# Patient Record
Sex: Male | Born: 1935 | Hispanic: No | Marital: Married | Smoking: Former smoker
Health system: Southern US, Community
[De-identification: ages and names within clinical notes are randomized; demographics above are authoritative.]

## PROBLEM LIST (undated history)

## (undated) DIAGNOSIS — R0602 Shortness of breath: Secondary | ICD-10-CM

## (undated) DIAGNOSIS — I10 Essential (primary) hypertension: Secondary | ICD-10-CM

## (undated) DIAGNOSIS — K219 Gastro-esophageal reflux disease without esophagitis: Secondary | ICD-10-CM

## (undated) DIAGNOSIS — Z9289 Personal history of other medical treatment: Secondary | ICD-10-CM

## (undated) DIAGNOSIS — I2510 Atherosclerotic heart disease of native coronary artery without angina pectoris: Secondary | ICD-10-CM

## (undated) DIAGNOSIS — H5789 Other specified disorders of eye and adnexa: Secondary | ICD-10-CM

## (undated) HISTORY — DX: Atherosclerotic heart disease of native coronary artery without angina pectoris: I25.10

## (undated) HISTORY — DX: Essential (primary) hypertension: I10

## (undated) HISTORY — DX: Personal history of other medical treatment: Z92.89

## (undated) HISTORY — DX: Other specified disorders of eye and adnexa: H57.89

## (undated) HISTORY — DX: Shortness of breath: R06.02

---

## 2013-12-30 ENCOUNTER — Encounter: Payer: Self-pay | Admitting: Cardiology

## 2014-01-12 ENCOUNTER — Ambulatory Visit (INDEPENDENT_AMBULATORY_CARE_PROVIDER_SITE_OTHER): Payer: No Typology Code available for payment source | Admitting: Cardiology

## 2014-01-12 ENCOUNTER — Encounter: Payer: Self-pay | Admitting: Cardiology

## 2014-01-12 VITALS — BP 172/102 | HR 106 | Ht 65.0 in | Wt 142.0 lb

## 2014-01-12 DIAGNOSIS — I4510 Unspecified right bundle-branch block: Secondary | ICD-10-CM

## 2014-01-12 DIAGNOSIS — R0600 Dyspnea, unspecified: Secondary | ICD-10-CM | POA: Insufficient documentation

## 2014-01-12 DIAGNOSIS — R9431 Abnormal electrocardiogram [ECG] [EKG]: Secondary | ICD-10-CM | POA: Insufficient documentation

## 2014-01-12 DIAGNOSIS — R0989 Other specified symptoms and signs involving the circulatory and respiratory systems: Secondary | ICD-10-CM

## 2014-01-12 DIAGNOSIS — I10 Essential (primary) hypertension: Secondary | ICD-10-CM

## 2014-01-12 DIAGNOSIS — R0609 Other forms of dyspnea: Secondary | ICD-10-CM

## 2014-01-12 MED ORDER — LOSARTAN POTASSIUM-HCTZ 50-12.5 MG PO TABS: 1.0000 | ORAL_TABLET | Freq: Every day | ORAL | Status: DC

## 2014-01-12 NOTE — Progress Notes (Signed)
      [address] [place], [place]  [address] Phone: [phone] Fax:  [phone]  Date:  01/12/2014   ID:  Peter Kennedy, DOB [birth year], MRN [id number]  PCP:  No primary provider on file.   History of Present Illness: Peter Kennedy is a 79 y.o. male here for the evaluation of abnormal EKG, right bundle branch block, shortness of breath, angina. EKG personally reviewed from 12/30/13 which shows sinus rhythm rate 91 with right bundle branch block morphology and nonspecific ST-T wave changes. Creatinine is 0.85, LFTs are normal, hemoglobin 14.3 TSH is normal.   When traveling stairs can have SOB and mild CP. No radiation of symptoms. Relieved with rest. Concerned about the possibility of coronary artery disease. He was previously treated for hypertension in [place].   Wt Readings from Last 3 Encounters:  01/12/14 142 lb (64.411 kg)     Past Medical History  Diagnosis Date  . Hypertension   . Coronary artery disease   . SOB (shortness of breath)   . Eye inflamed     History reviewed. No pertinent past surgical history.  Current Outpatient Prescriptions  Medication Sig Dispense Refill  . aspirin 81 MG tablet Take 81 mg by mouth daily.      . Multiple Vitamins-Minerals (MULTIVITAMIN PO) Take by mouth daily.      . nitroGLYCERIN (NITROSTAT) 0.4 MG SL tablet Place 0.4 mg under the tongue every 5 (five) minutes as needed for chest pain.       No current facility-administered medications for this visit.    Allergies:   No Known Allergies  Social History:  The patient  reports that he has quit smoking. He does not have any smokeless tobacco history on file. He reports that he does not drink alcohol or use illicit drugs. Used to smoke 20 years ago.   History reviewed. No pertinent family history.  ROS:  Please see the history of present illness.   Denies any fevers, chills, orthopnea, PND, syncope, bleeding, melena, rash. Positive for shortness of breath, occasional  chest pain when going up stairs.  All other systems reviewed and negative.   PHYSICAL EXAM: VS:  BP 172/102  Pulse 106  Ht 5\' 5"  (1.651 m)  Wt 142 lb (64.411 kg)  BMI 23.63 kg/m2 Well nourished, well developed, in no acute distress HEENT: normal, [place]/AT, EOMI Neck: no JVD, normal carotid upstroke, no bruit Cardiac:  normal S1, S2; RRR; no murmur Lungs:  clear to auscultation bilaterally, no wheezing, rhonchi or rales Abd: soft, nontender, no hepatomegaly, no bruits Ext: no edema, 2+ distal pulses Skin: warm and dry GU: deferred Neuro: no focal abnormalities noted, AAO x 3  EKG:  Sinus tachycardia, 106, nonspecific interventricular conduction delay  prior EKG demonstrates right bundle branch block. Labs: Normal TSH, normal creatinine, normal hemoglobin. Prior medical record reviewed.  ASSESSMENT AND PLAN:  1. Dyspnea on exertion-could be because of uncontrolled hypertension. Other possibilities include angina, cardiomyopathy, CAD. 2. Hypertension-uncontrolled. On repeat his blood pressure was 180/90. I would like to go ahead and start him on antihypertensive medication losartan/HCT 50/12.5. I will see him back in one week, APP clinic. If blood pressure under good control, please set him up for a nuclear stress test. I will also check an echocardiogram. 3. Abnormal EKG-previous right bundle branch block noted, nonspecific interventricular conduction delay on current EKG.  Signed, Donato Schultz, MD [facility]  01/12/2014 2:55 PM

## 2014-01-12 NOTE — Patient Instructions (Addendum)
Your physician has recommended you make the following change in your medication:   1. Start Losartan-HCTZ 50-12.5mg  once daily  Your physician has requested that you have an echocardiogram. Echocardiography is a painless test that uses sound waves to create images of your heart. It provides your doctor with information about the size and shape of your heart and how well your heart's chambers and valves are working. This procedure takes approximately one hour. There are no restrictions for this procedure.  Your physician recommends that you schedule a follow-up appointment in: 1-2 week with Dr. Anne Fu or PA/NP.

## 2014-01-20 ENCOUNTER — Ambulatory Visit (HOSPITAL_COMMUNITY): Payer: No Typology Code available for payment source

## 2014-01-21 ENCOUNTER — Ambulatory Visit (INDEPENDENT_AMBULATORY_CARE_PROVIDER_SITE_OTHER): Payer: No Typology Code available for payment source | Admitting: Physician Assistant

## 2014-01-21 ENCOUNTER — Encounter: Payer: Self-pay | Admitting: Physician Assistant

## 2014-01-21 VITALS — BP 130/82 | HR 98 | Ht 65.0 in | Wt 140.0 lb

## 2014-01-21 DIAGNOSIS — R079 Chest pain, unspecified: Secondary | ICD-10-CM

## 2014-01-21 DIAGNOSIS — I10 Essential (primary) hypertension: Secondary | ICD-10-CM

## 2014-01-21 DIAGNOSIS — R9431 Abnormal electrocardiogram [ECG] [EKG]: Secondary | ICD-10-CM

## 2014-01-21 DIAGNOSIS — R0602 Shortness of breath: Secondary | ICD-10-CM

## 2014-01-21 MED ORDER — FLUTICASONE-SALMETEROL 250-50 MCG/DOSE IN AEPB: 1.0000 | INHALATION_SPRAY | Freq: Two times a day (BID) | RESPIRATORY_TRACT | Status: AC

## 2014-01-21 MED ORDER — ALBUTEROL SULFATE HFA 108 (90 BASE) MCG/ACT IN AERS: 2.0000 | INHALATION_SPRAY | Freq: Four times a day (QID) | RESPIRATORY_TRACT | Status: AC | PRN

## 2014-01-21 NOTE — Progress Notes (Signed)
[address] [place], [place]  [address] Phone: [phone]([phone] Fax:  [phone]  Date:  01/21/2014   ID:  Peter DibblesMalik Kennedy, DOB 06/05/1935, MRN [id number]  PCP:  Marnette BurgessMonica Bartorelli, NP ([facility]) Cardiologist:  Dr. Donato SchultzMark Skains     History of Present Illness: Peter DibblesMalik Bartling is a 79 y.o. [place] male who was recently evaluated by Dr. Donato SchultzMark Skains for abnormal ECG (RBBB), uncontrolled HTN, dyspnea and chest pain.  Hyzaar was added for BP control.  Echocardiogram is pending.  He will eventually need a stress test to exclude ischemia.  Of note, the patient has been placed on Proventil HFA as well as Advair 250/50 by his primary care provider. His breathing is better. He denies chest pain or syncope. He probably describes NYHA class II symptoms. He denies orthopnea, PND or edema. He does have a chronic cough.   Recent Labs: No results found for requested labs within last 365 days.   Wt Readings from Last 3 Encounters:  01/21/14 140 lb (63.504 kg)  01/12/14 142 lb (64.411 kg)     Past Medical History  Diagnosis Date  . Hypertension   . Coronary artery disease   . SOB (shortness of breath)   . Eye inflamed     Current Outpatient Prescriptions  Medication Sig Dispense Refill  . aspirin 81 MG tablet Take 81 mg by mouth daily.      Marland Kitchen. losartan-hydrochlorothiazide (HYZAAR) 50-12.5 MG per tablet Take 1 tablet by mouth daily.  30 tablet  3  . Multiple Vitamins-Minerals (MULTIVITAMIN PO) Take by mouth daily.      . nitroGLYCERIN (NITROSTAT) 0.4 MG SL tablet Place 0.4 mg under the tongue every 5 (five) minutes as needed for chest pain.      Marland Kitchen. albuterol (PROVENTIL HFA;VENTOLIN HFA) 108 (90 BASE) MCG/ACT inhaler Inhale 2 puffs into the lungs every 6 (six) hours as needed for wheezing or shortness of breath.      . Fluticasone-Salmeterol (ADVAIR) 250-50 MCG/DOSE AEPB Inhale 1 puff into the lungs 2 (two) times daily.       No current facility-administered medications for  this visit.    Allergies:   Review of patient's allergies indicates no known allergies.   Social History:  The patient  reports that he has quit smoking. He does not have any smokeless tobacco history on file. He reports that he does not drink alcohol or use illicit drugs.   Family History:  The patient's family history is not on file.   ROS:  Please see the history of present illness.      All other systems reviewed and negative.   PHYSICAL EXAM: VS:  BP 130/82  Pulse 98  Ht 5\' 5"  (1.651 m)  Wt 140 lb (63.504 kg)  BMI 23.30 kg/m2 Well nourished, well developed, in no acute distress HEENT: normal Neck: no JVD Cardiac:  distant S1, S2; RRR; no murmur Lungs:  Decreased breath sounds bilaterally, diffuse expiratory wheezes Abd: soft, nontender, no hepatomegaly Ext: no edema Skin: warm and dry Neuro:  CNs 2-12 intact, no focal abnormalities noted  EKG:  NSR, HR 98, IVCD versus LBBB, no change from prior tracing     ASSESSMENT AND PLAN:  1. Dyspnea: Improved. Question if this is improved with improved blood pressure control versus treatment for underlying pulmonary issues. I suspect he probably has COPD. Echocardiogram is pending. [facility] previously suggested by Dr. Anne FuSkains. 2. Hypertension:  Better control. Arrange  follow up basic metabolic panel. 3. Disposition:  Follow up with me or Dr. Anne Fu in one to 2 months.  Signed, Tereso Newcomer, PA-C  01/21/2014 10:41 AM

## 2014-01-21 NOTE — Patient Instructions (Signed)
YOU HAVE BEEN GIVEN AN RX FOR LAB WORK TO BE DONE WITH YOUR PRIMARY CARE PHYSICIAN WITH THE RESULTS TO BE FAXED TO [place] [phone]  Your physician has requested that you have a lexiscan myoview. For further information please visit [email or web address] Please follow instruction sheet, as given.  Your physician recommends that you schedule a follow-up appointment in: 3-4 WEEKS AFTER ECHO AND MYOVIEW ARE DONE ; WITH SCOTT WEAVER, PAC SAME DAY DR. Anne Fu IS IN THE OFFICE

## 2014-01-22 ENCOUNTER — Encounter: Payer: Self-pay | Admitting: Physician Assistant

## 2014-01-28 ENCOUNTER — Ambulatory Visit (HOSPITAL_COMMUNITY): Payer: No Typology Code available for payment source

## 2014-01-29 ENCOUNTER — Telehealth: Payer: Self-pay | Admitting: Physician Assistant

## 2014-01-29 NOTE — Telephone Encounter (Signed)
Please request most recent BMET from patient's PCP. Tereso Newcomer, PA-C   01/29/2014 1:54 PM

## 2014-02-02 NOTE — Telephone Encounter (Signed)
Last bmet done 12/30/13; labs faxed over today for PA to review

## 2014-02-02 NOTE — Telephone Encounter (Signed)
I will call pt's PCP and bmet

## 2014-02-03 ENCOUNTER — Ambulatory Visit (HOSPITAL_COMMUNITY): Payer: No Typology Code available for payment source | Attending: Internal Medicine | Admitting: Radiology

## 2014-02-03 VITALS — BP 152/83 | Ht 65.0 in | Wt 140.0 lb

## 2014-02-03 DIAGNOSIS — R0602 Shortness of breath: Secondary | ICD-10-CM | POA: Insufficient documentation

## 2014-02-03 DIAGNOSIS — R079 Chest pain, unspecified: Secondary | ICD-10-CM | POA: Insufficient documentation

## 2014-02-03 DIAGNOSIS — I447 Left bundle-branch block, unspecified: Secondary | ICD-10-CM | POA: Insufficient documentation

## 2014-02-03 DIAGNOSIS — E785 Hyperlipidemia, unspecified: Secondary | ICD-10-CM | POA: Insufficient documentation

## 2014-02-03 DIAGNOSIS — I2510 Atherosclerotic heart disease of native coronary artery without angina pectoris: Secondary | ICD-10-CM | POA: Insufficient documentation

## 2014-02-03 DIAGNOSIS — R0609 Other forms of dyspnea: Secondary | ICD-10-CM | POA: Insufficient documentation

## 2014-02-03 DIAGNOSIS — I10 Essential (primary) hypertension: Secondary | ICD-10-CM

## 2014-02-03 DIAGNOSIS — R0989 Other specified symptoms and signs involving the circulatory and respiratory systems: Secondary | ICD-10-CM | POA: Insufficient documentation

## 2014-02-03 DIAGNOSIS — I252 Old myocardial infarction: Secondary | ICD-10-CM | POA: Insufficient documentation

## 2014-02-03 DIAGNOSIS — Z87891 Personal history of nicotine dependence: Secondary | ICD-10-CM | POA: Insufficient documentation

## 2014-02-03 DIAGNOSIS — R9431 Abnormal electrocardiogram [ECG] [EKG]: Secondary | ICD-10-CM

## 2014-02-03 DIAGNOSIS — R0600 Dyspnea, unspecified: Secondary | ICD-10-CM

## 2014-02-03 DIAGNOSIS — R002 Palpitations: Secondary | ICD-10-CM | POA: Insufficient documentation

## 2014-02-03 MED ORDER — TECHNETIUM TC 99M SESTAMIBI GENERIC - CARDIOLITE
10.0000 | Freq: Once | INTRAVENOUS | Status: AC | PRN
  Administered 2014-02-03: 10 via INTRAVENOUS

## 2014-02-03 MED ORDER — REGADENOSON 0.4 MG/5ML IV SOLN
0.4000 mg | Freq: Once | INTRAVENOUS | Status: AC
  Administered 2014-02-03: 0.4 mg via INTRAVENOUS

## 2014-02-03 MED ORDER — TECHNETIUM TC 99M SESTAMIBI GENERIC - CARDIOLITE
30.0000 | Freq: Once | INTRAVENOUS | Status: AC | PRN
  Administered 2014-02-03: 30 via INTRAVENOUS

## 2014-02-03 NOTE — Progress Notes (Signed)
[facility] SITE 3 NUCLEAR MED [address] [place], [place] [address] [phone]    Cardiology Nuclear Med Study  Peter Kennedy is a 79 y.o. male     MRN : [id number]     DOB: Feb 05, 1935  Procedure Date: 02/03/2014  Nuclear Med Background Indication for Stress Test:  Evaluation for Ischemia History: CAD;MI (78yrs ago); Cardiac Risk Factors: History of Smoking, Hypertension, LBBB and Lipids  Symptoms:  Chest Pain, Chest Pain with Exertion (last date of chest discomfort 01/2014), DOE, Palpitations and SOB   Nuclear Pre-Procedure Caffeine/Decaff Intake:  9:00pm NPO After: 12:00am   Lungs:  clear O2 Sat: 97% on room air. IV 0.9% NS with Angio Cath:  22g  IV Site: R Wrist x 1, tolerated well IV Started by:  Peter Hong, RN  Chest Size (in):  40 Cup Size: n/a  Height: 5\' 5"  (1.651 m)  Weight:  140 lb (63.504 kg)  BMI:  Body mass index is 23.3 kg/(m^2). Tech Comments:  No medications today    Nuclear Med Study 1 or 2 day study: 1 day  Stress Test Type:  Lexiscan  Reading MD: N/A  Order Authorizing Provider:  Donato Schultz, MD  Resting Radionuclide: Technetium 53m Sestamibi  Resting Radionuclide Dose: 11.0 mCi   Stress Radionuclide:  Technetium 88m Sestamibi  Stress Radionuclide Dose: 33.0 mCi           Stress Protocol Rest HR: 82 Stress HR: 97  Rest BP: 152/83 Stress BP: 163/94  Exercise Time (min): n/a METS: n/a   Predicted Max HR: 143 bpm % Max HR: 67.83 bpm Rate Pressure Product: 27035   Dose of Adenosine (mg):  n/a Dose of Lexiscan: 0.4 mg  Dose of Atropine (mg): n/a Dose of Dobutamine: n/a mcg/kg/min (at max HR)  Stress Test Technologist: Peter Kennedy, EMT-P  Nuclear Technologist:  Peter Kennedy, CNMT     Rest Procedure:  Myocardial perfusion imaging was performed at rest 45 minutes following the intravenous administration of Technetium 58m Sestamibi. Rest ECG: Normal sinus rhythm with left bundle-branch block.  Stress Procedure:  The patient  received IV Lexiscan 0.4 mg over 15-seconds.  Technetium 40m Sestamibi injected at 30-seconds.  Quantitative spect images were obtained after a 45 minute delay. Stress ECG: No significant change from baseline ECG  QPS Raw Data Images:  Normal; no motion artifact; normal heart/lung ratio. Stress Images:  There is a large area of severe decreased uptake affecting the mid/apical inferior segments, mid inferoseptal segment, apical septal segment, apical cap, base/mid inferolateral segments, base/mid anterolateral segments, and the apical lateral segment. These abnormalities are fixed. Rest Images:  The rest image is the same as the stress image. Subtraction (SDS):  There is no obvious ischemia. Transient Ischemic Dilatation (Normal <1.22):  0.98 Lung/Heart Ratio (Normal <0.45):  0.38  Quantitative Gated Spect Images QGS EDV:  106 ml QGS ESV:  65 ml  Impression Exercise Capacity:  Lexiscan with no exercise. BP Response:  Normal blood pressure response. Clinical Symptoms:  No significant symptoms noted. ECG Impression:  No significant ST segment change suggestive of ischemia. Comparison with Prior Nuclear Study: No images to compare  Overall Impression:  This study is abnormal with moderate risk. There is left ventricular dysfunction. There is evidence of an old scar that is large. There is no significant ischemia. The scar affects the inferior wall and the apex in the inferolateral wall primarily. It also affects partially the anterolateral wall.  LV Ejection Fraction: 38%.  LV Wall  Motion:  Hypokinesis of the septum, inferior wall and lateral wall.  [name], MD

## 2014-02-04 ENCOUNTER — Ambulatory Visit (HOSPITAL_COMMUNITY)
Admission: RE | Admit: 2014-02-04 | Discharge: 2014-02-04 | Disposition: A | Discharge status: Home / Self Care | Payer: No Typology Code available for payment source | Source: Ambulatory Visit | Attending: Cardiology | Admitting: Cardiology

## 2014-02-04 ENCOUNTER — Encounter: Payer: Self-pay | Admitting: Physician Assistant

## 2014-02-04 DIAGNOSIS — I059 Rheumatic mitral valve disease, unspecified: Secondary | ICD-10-CM

## 2014-02-04 DIAGNOSIS — R0600 Dyspnea, unspecified: Secondary | ICD-10-CM

## 2014-02-04 DIAGNOSIS — R0989 Other specified symptoms and signs involving the circulatory and respiratory systems: Principal | ICD-10-CM | POA: Insufficient documentation

## 2014-02-04 DIAGNOSIS — R0609 Other forms of dyspnea: Secondary | ICD-10-CM | POA: Insufficient documentation

## 2014-02-04 NOTE — Progress Notes (Signed)
2D Echo Performed 02/04/2014    Taiyo Kozma, RCS  

## 2014-02-08 ENCOUNTER — Telehealth: Payer: Self-pay | Admitting: *Deleted

## 2014-02-08 ENCOUNTER — Other Ambulatory Visit: Payer: No Typology Code available for payment source

## 2014-02-08 ENCOUNTER — Encounter: Payer: Self-pay | Admitting: Cardiology

## 2014-02-08 ENCOUNTER — Ambulatory Visit (INDEPENDENT_AMBULATORY_CARE_PROVIDER_SITE_OTHER): Payer: No Typology Code available for payment source | Admitting: Cardiology

## 2014-02-08 VITALS — BP 120/68 | HR 94 | Ht 65.0 in | Wt 144.0 lb

## 2014-02-08 DIAGNOSIS — I428 Other cardiomyopathies: Secondary | ICD-10-CM

## 2014-02-08 DIAGNOSIS — I4510 Unspecified right bundle-branch block: Secondary | ICD-10-CM

## 2014-02-08 DIAGNOSIS — R0600 Dyspnea, unspecified: Secondary | ICD-10-CM

## 2014-02-08 DIAGNOSIS — I10 Essential (primary) hypertension: Secondary | ICD-10-CM

## 2014-02-08 DIAGNOSIS — R0609 Other forms of dyspnea: Secondary | ICD-10-CM

## 2014-02-08 DIAGNOSIS — R0989 Other specified symptoms and signs involving the circulatory and respiratory systems: Secondary | ICD-10-CM

## 2014-02-08 DIAGNOSIS — I420 Dilated cardiomyopathy: Secondary | ICD-10-CM

## 2014-02-08 MED ORDER — ATORVASTATIN CALCIUM 40 MG PO TABS: 40.0000 mg | ORAL_TABLET | Freq: Every day | ORAL | Status: DC

## 2014-02-08 MED ORDER — METOPROLOL SUCCINATE ER 25 MG PO TB24
25.0000 mg | ORAL_TABLET | Freq: Every day | ORAL | Status: DC
Start: 2014-02-08 — End: 2014-04-25

## 2014-02-08 NOTE — Addendum Note (Signed)
Addended by: Tonita Phoenix on: 02/08/2014 04:23 PM   Modules accepted: Orders

## 2014-02-08 NOTE — Telephone Encounter (Signed)
pt notified about lab results that were sent over from Dtr. Bartorelli's office. Pt verbalized understanding and will have repeat bmet today at appt with Dr. Anne Fu.

## 2014-02-08 NOTE — Progress Notes (Signed)
[address] [place], [place]  [address] Phone: [phone]([phone] Fax:  [phone]) [phone]  Date:  02/08/2014   ID:  Peter Kennedy, DOB 02/26/1935, MRN [id number]  PCP:  Marnette BurgessMonica Bartorelli, NP ([facility]) Cardiologist:  Dr. Donato SchultzMark Kawana Hegel     History of Present Illness: Peter Kennedy is a 79 y.o. [place] male who was recently evaluated for abnormal ECG (RBBB), uncontrolled HTN, dyspnea and chest pain.  Hyzaar was added for BP control.  Echocardiogram showed EF 25-30%.  Nuclear stress test 02/04/14: This study is abnormal with moderate risk. There is left ventricular dysfunction. There is evidence of an old scar that is large. There is no significant ischemia. The scar affects the inferior wall and the apex in the inferolateral wall primarily. It also affects partially the anterolateral wall.  LV Ejection Fraction: 38%. LV Wall Motion: Hypokinesis of the septum, inferior wall and lateral wall.    His breathing is better. He denies chest pain or syncope. He probably describes NYHA class II symptoms. He denies orthopnea, PND or edema. He does have a chronic cough.   Recent Labs: No results found for requested labs within last 365 days.   Wt Readings from Last 3 Encounters:  02/08/14 144 lb (65.318 kg)  02/03/14 140 lb (63.504 kg)  01/21/14 140 lb (63.504 kg)     Past Medical History  Diagnosis Date  . Hypertension   . Coronary artery disease   . SOB (shortness of breath)   . Eye inflamed   . Hx of cardiovascular stress test     Lexiscan Myoview (01/2014):  Inf, apical and IL scar; no ischemia; EF 38%; Mod Risk.    Current Outpatient Prescriptions  Medication Sig Dispense Refill  . albuterol (PROVENTIL HFA;VENTOLIN HFA) 108 (90 BASE) MCG/ACT inhaler Inhale 2 puffs into the lungs every 6 (six) hours as needed for wheezing or shortness of breath.      Marland Kitchen. aspirin 81 MG tablet Take 81 mg by mouth daily.      . Fluticasone-Salmeterol (ADVAIR) 250-50 MCG/DOSE AEPB Inhale 1  puff into the lungs 2 (two) times daily.      Marland Kitchen. losartan-hydrochlorothiazide (HYZAAR) 50-12.5 MG per tablet Take 1 tablet by mouth daily.      . Multiple Vitamins-Minerals (MULTIVITAMIN PO) Take by mouth daily.      . nitroGLYCERIN (NITROSTAT) 0.4 MG SL tablet Place 0.4 mg under the tongue every 5 (five) minutes as needed for chest pain.       No current facility-administered medications for this visit.    Allergies:   Review of patient's allergies indicates no known allergies.   Social History:  The patient  reports that he has quit smoking. He does not have any smokeless tobacco history on file. He reports that he does not drink alcohol or use illicit drugs.   Family History:  The patient's family history is not on file.   ROS:  Please see the history of present illness.      All other systems reviewed and negative.   PHYSICAL EXAM: VS:  BP 120/68  Pulse 94  Ht 5\' 5"  (1.651 m)  Wt 144 lb (65.318 kg)  BMI 23.96 kg/m2 Well nourished, well developed, in no acute distress HEENT: normal Neck: no JVD Cardiac:  distant S1, S2; RRR; no murmur Lungs:  Decreased breath sounds bilaterally, diffuse expiratory wheezes Abd: soft, nontender, no hepatomegaly Ext: no edema2+ radial pulses. Skin: warm and dry Neuro:  CNs 2-12  intact, no focal abnormalities noted  EKG:  NSR, HR 98, IVCD versus LBBB, no change from prior tracing     ASSESSMENT AND PLAN:  1. Dyspnea/cardiomyopathy-newly discovered. EF 25% on echo, 38% on nuclear with inferior/inferolateral scar. We have discussed cardiac catheterization, risks, benefits including stroke, heart attack, death, renal impairment, bleeding. Right radial artery approach. I would like to perform both right and left heart catheterization. Right heart catheterization can be performed via the brachial vein. He understands the risks of possible delay of cardiac catheterization. He must return home since he is an elder until March 26. I explained to him the  possible risks of travel at this time. He understands. I will start metoprolol extended release 25 mg once a day. He is not having any active angina, shortness of breath has improved with hypertension control. 2. Hypertension:  Better control. Check CMET. CBC 3. We're going to start atorvastatin 40 mg given his old infarct pattern. Check lipids. ALT in 2 months. 4. Disposition:  Follow up with me when he returns from his travels, then we will schedule the heart catheterization.  Mathews Robinsons, MD   02/08/2014 3:41 PM

## 2014-02-08 NOTE — Patient Instructions (Signed)
Your physician has recommended you make the following change in your medication:   1. Start Metoprolol  ER 25mg  once daily. 2. Start Atorvastatin 40mg  once daily.  Your physician recommends that you have labs today: CMET, CBC, Lipid  Your physician recommends that you return for lab work in: 1 months, Lipid, ALT  Your physician recommends that you schedule a follow-up appointment in: 1 month with Dr. Anne Fu. To discuss Catheterization.

## 2014-02-09 LAB — LIPID PANEL
CHOL/HDL RATIO: 7
CHOLESTEROL: 239 mg/dL — AB (ref 0–200)
HDL: 35.7 mg/dL — AB (ref >39.00)
LDL CALC: 143 mg/dL — AB (ref 0–99)
Triglycerides: 301 mg/dL — ABNORMAL HIGH (ref 0.0–149.0)
VLDL: 60.2 mg/dL — ABNORMAL HIGH (ref 0.0–40.0)

## 2014-02-09 LAB — CBC WITH DIFFERENTIAL/PLATELET
BASOS PCT: 0.7 % (ref 0.0–3.0)
Basophils Absolute: 0.1 10*3/uL (ref 0.0–0.1)
EOS PCT: 2.9 % (ref 0.0–5.0)
Eosinophils Absolute: 0.2 10*3/uL (ref 0.0–0.7)
HCT: 38.3 % — ABNORMAL LOW (ref 39.0–52.0)
Hemoglobin: 13 g/dL (ref 13.0–17.0)
LYMPHS PCT: 24.9 % (ref 12.0–46.0)
Lymphs Abs: 2 10*3/uL (ref 0.7–4.0)
MCHC: 34 g/dL (ref 30.0–36.0)
MCV: 88.1 fl (ref 78.0–100.0)
Monocytes Absolute: 0.7 10*3/uL (ref 0.1–1.0)
Monocytes Relative: 8.6 % (ref 3.0–12.0)
NEUTROS PCT: 62.9 % (ref 43.0–77.0)
Neutro Abs: 5 10*3/uL (ref 1.4–7.7)
PLATELETS: 257 10*3/uL (ref 150.0–400.0)
RBC: 4.34 Mil/uL (ref 4.22–5.81)
RDW: 13.5 % (ref 11.5–14.6)
WBC: 7.9 10*3/uL (ref 4.5–10.5)

## 2014-02-09 LAB — COMPREHENSIVE METABOLIC PANEL
ALBUMIN: 4.1 g/dL (ref 3.5–5.2)
ALT: 16 U/L (ref 0–53)
AST: 18 U/L (ref 0–37)
Alkaline Phosphatase: 58 U/L (ref 39–117)
BUN: 23 mg/dL (ref 6–23)
CALCIUM: 9 mg/dL (ref 8.4–10.5)
CHLORIDE: 106 meq/L (ref 96–112)
CO2: 28 mEq/L (ref 19–32)
Creatinine, Ser: 1 mg/dL (ref 0.4–1.5)
GFR: 74.39 mL/min (ref >60.00)
GLUCOSE: 94 mg/dL (ref 70–99)
POTASSIUM: 4.3 meq/L (ref 3.5–5.1)
Sodium: 139 mEq/L (ref 135–145)
Total Bilirubin: 0.6 mg/dL (ref 0.3–1.2)
Total Protein: 7.6 g/dL (ref 6.0–8.3)

## 2014-02-09 LAB — ALT: ALT: 16 U/L (ref 0–53)

## 2014-04-05 ENCOUNTER — Ambulatory Visit (INDEPENDENT_AMBULATORY_CARE_PROVIDER_SITE_OTHER): Payer: No Typology Code available for payment source | Admitting: Cardiology

## 2014-04-05 ENCOUNTER — Encounter: Payer: Self-pay | Admitting: *Deleted

## 2014-04-05 ENCOUNTER — Encounter (INDEPENDENT_AMBULATORY_CARE_PROVIDER_SITE_OTHER): Payer: Self-pay

## 2014-04-05 ENCOUNTER — Ambulatory Visit: Payer: Self-pay | Admitting: Physician Assistant

## 2014-04-05 ENCOUNTER — Other Ambulatory Visit: Payer: No Typology Code available for payment source

## 2014-04-05 ENCOUNTER — Encounter: Payer: Self-pay | Admitting: Cardiology

## 2014-04-05 VITALS — BP 120/80 | HR 76 | Ht 65.0 in | Wt 137.0 lb

## 2014-04-05 DIAGNOSIS — I4510 Unspecified right bundle-branch block: Secondary | ICD-10-CM

## 2014-04-05 DIAGNOSIS — I252 Old myocardial infarction: Secondary | ICD-10-CM

## 2014-04-05 DIAGNOSIS — I420 Dilated cardiomyopathy: Secondary | ICD-10-CM | POA: Insufficient documentation

## 2014-04-05 DIAGNOSIS — I10 Essential (primary) hypertension: Secondary | ICD-10-CM

## 2014-04-05 DIAGNOSIS — I428 Other cardiomyopathies: Secondary | ICD-10-CM

## 2014-04-05 LAB — PROTIME-INR
INR: 1 ratio (ref 0.8–1.0)
PROTHROMBIN TIME: 11.6 s (ref 9.6–13.1)

## 2014-04-05 LAB — BASIC METABOLIC PANEL
BUN: 20 mg/dL (ref 6–23)
CALCIUM: 9.4 mg/dL (ref 8.4–10.5)
CO2: 27 mEq/L (ref 19–32)
CREATININE: 1 mg/dL (ref 0.4–1.5)
Chloride: 102 mEq/L (ref 96–112)
GFR: 77.84 mL/min (ref >60.00)
Glucose, Bld: 84 mg/dL (ref 70–99)
Potassium: 4.9 mEq/L (ref 3.5–5.1)
SODIUM: 136 meq/L (ref 135–145)

## 2014-04-05 LAB — CBC WITH DIFFERENTIAL/PLATELET
BASOS PCT: 0.4 % (ref 0.0–3.0)
Basophils Absolute: 0 10*3/uL (ref 0.0–0.1)
EOS PCT: 2.3 % (ref 0.0–5.0)
Eosinophils Absolute: 0.3 10*3/uL (ref 0.0–0.7)
HCT: 43.5 % (ref 39.0–52.0)
HEMOGLOBIN: 14.6 g/dL (ref 13.0–17.0)
LYMPHS PCT: 17.7 % (ref 12.0–46.0)
Lymphs Abs: 2 10*3/uL (ref 0.7–4.0)
MCHC: 33.6 g/dL (ref 30.0–36.0)
MCV: 88.5 fl (ref 78.0–100.0)
MONOS PCT: 11.8 % (ref 3.0–12.0)
Monocytes Absolute: 1.3 10*3/uL — ABNORMAL HIGH (ref 0.1–1.0)
NEUTROS PCT: 67.8 % (ref 43.0–77.0)
Neutro Abs: 7.6 10*3/uL (ref 1.4–7.7)
Platelets: 291 10*3/uL (ref 150.0–400.0)
RBC: 4.91 Mil/uL (ref 4.22–5.81)
RDW: 13.2 % (ref 11.5–14.6)
WBC: 11.2 10*3/uL — AB (ref 4.5–10.5)

## 2014-04-05 NOTE — Patient Instructions (Signed)
Your physician recommends that you have  lab work today--BMET/CBCd/PT/INR.  Your physician has requested that you have a cardiac catheterization. Cardiac catheterization is used to diagnose and/or treat various heart conditions. Doctors may recommend this procedure for a number of different reasons. The most common reason is to evaluate chest pain. Chest pain can be a symptom of coronary artery disease (CAD), and cardiac catheterization can show whether plaque is narrowing or blocking your heart's arteries. This procedure is also used to evaluate the valves, as well as measure the blood flow and oxygen levels in different parts of your heart. For further information please visit [email or web address] Please follow instruction sheet, as given. Tuesday May 12,2015  Your physician recommends that you schedule a follow-up appointment in: about 3 weeks with Dr Anne Fu.

## 2014-04-05 NOTE — Progress Notes (Signed)
[address] [place], [place]  [address] Phone: [phone] Fax:  [phone]  Date:  04/05/2014   ID:  Peter Kennedy, DOB 07/06/1935, MRN [id number]  PCP:  Marnette Burgess, NP ([facility]) Cardiologist:  Dr. Donato Schultz     History of Present Illness: Peter Kennedy is a 79 y.o. [place] male who was recently evaluated for abnormal ECG (RBBB), uncontrolled HTN, dyspnea and chest pain.  Hyzaar was added for BP control.  Echocardiogram showed EF 25-30%.  Nuclear stress test 02/04/14: This study is abnormal with moderate risk. There is left ventricular dysfunction. There is evidence of an old scar that is large. There is no significant ischemia. The scar affects the inferior wall and the apex in the inferolateral wall primarily. It also affects partially the anterolateral wall.  LV Ejection Fraction: 38%. LV Wall Motion: Hypokinesis of the septum, inferior wall and lateral wall.    His breathing is better. He denies chest pain or syncope. He probably describes NYHA class II symptoms. He denies orthopnea, PND or edema. He does have a chronic cough.  Ready for cath. Did well on his trip.  Recent Labs: 02/08/2014: ALT 16; ALT 16; Creatinine 1.0; HDL Cholesterol 35.70*; Hemoglobin 13.0; LDL (calc) 143*; Potassium 4.3    Wt Readings from Last 3 Encounters:  04/05/14 137 lb (62.143 kg)  02/08/14 144 lb (65.318 kg)  02/03/14 140 lb (63.504 kg)     Past Medical History  Diagnosis Date  . Hypertension   . Coronary artery disease   . SOB (shortness of breath)   . Eye inflamed   . Hx of cardiovascular stress test     Lexiscan Myoview (01/2014):  Inf, apical and IL scar; no ischemia; EF 38%; Mod Risk.    Current Outpatient Prescriptions  Medication Sig Dispense Refill  . albuterol (PROVENTIL HFA;VENTOLIN HFA) 108 (90 BASE) MCG/ACT inhaler Inhale 2 puffs into the lungs every 6 (six) hours as needed for wheezing or shortness of breath.      Marland Kitchen atorvastatin  (LIPITOR) 40 MG tablet Take 1 tablet (40 mg total) by mouth daily.  30 tablet  3  . Fluticasone-Salmeterol (ADVAIR) 250-50 MCG/DOSE AEPB Inhale 1 puff into the lungs 2 (two) times daily.      Marland Kitchen losartan-hydrochlorothiazide (HYZAAR) 50-12.5 MG per tablet Take 1 tablet by mouth daily.      . metoprolol succinate (TOPROL XL) 25 MG 24 hr tablet Take 1 tablet (25 mg total) by mouth daily.  30 tablet  3  . Multiple Vitamins-Minerals (MULTIVITAMIN PO) Take by mouth daily.      . nitroGLYCERIN (NITROSTAT) 0.4 MG SL tablet Place 0.4 mg under the tongue every 5 (five) minutes as needed for chest pain.       No current facility-administered medications for this visit.    Allergies:   Review of patient's allergies indicates no known allergies.   Social History:  The patient  reports that he has quit smoking. He does not have any smokeless tobacco history on file. He reports that he does not drink alcohol or use illicit drugs.   Family History:  The patient's family history is not on file.   ROS:  Please see the history of present illness.      All other systems reviewed and negative.   PHYSICAL EXAM: VS:  BP 120/80  Pulse 76  Ht 5\' 5"  (1.651 m)  Wt 137 lb (62.143 kg)  BMI 22.80 kg/m2 Well  nourished, well developed, in no acute distressThin HEENT: normal Neck: no JVD Cardiac:  distant S1, S2; RRR; no murmur Lungs:  Decreased breath sounds bilaterally, diffuse expiratory wheezes Abd: soft, nontender, no hepatomegaly Ext: no edema2+ radial pulses. Skin: warm and dry Neuro:  CNs 2-12 intact, no focal abnormalities noted  EKG:  NSR, HR 98, IVCD versus LBBB, no change from prior tracing     ASSESSMENT AND PLAN:  1. Dyspnea/cardiomyopathy-newly discovered. Improved. EF 25% on echo, 38% on nuclear with inferior/inferolateral scar. We have discussed cardiac catheterization, risks, benefits including stroke, heart attack, death, renal impairment, bleeding. Right radial artery approach. I would like  to perform left heart catheterization. Continue metoprolol extended release 25 mg once a day. He is not having any active angina, shortness of breath has improved with hypertension control. 2. Hypertension:  Better control. Check CMET. CBC 3. We're going to start atorvastatin 40 mg given his old infarct pattern. Check lipids. ALT in 2 months. 4. Post cath fu  Signed, Donato SchultzMark Jacobs Golab, MD   04/05/2014 9:35 AM

## 2014-04-13 ENCOUNTER — Encounter (HOSPITAL_COMMUNITY): Payer: Self-pay | Admitting: Pharmacy Technician

## 2014-04-13 ENCOUNTER — Encounter (HOSPITAL_COMMUNITY)
Admission: RE | Disposition: A | Discharge status: Home / Self Care | Payer: Self-pay | Source: Ambulatory Visit | Attending: Cardiology

## 2014-04-13 ENCOUNTER — Ambulatory Visit (HOSPITAL_COMMUNITY)
Admission: RE | Admit: 2014-04-13 | Discharge: 2014-04-13 | Disposition: A | Discharge status: Home / Self Care | Payer: No Typology Code available for payment source | Source: Ambulatory Visit | Attending: Cardiology | Admitting: Cardiology

## 2014-04-13 DIAGNOSIS — Z87891 Personal history of nicotine dependence: Secondary | ICD-10-CM | POA: Insufficient documentation

## 2014-04-13 DIAGNOSIS — Z79899 Other long term (current) drug therapy: Secondary | ICD-10-CM | POA: Insufficient documentation

## 2014-04-13 DIAGNOSIS — I428 Other cardiomyopathies: Secondary | ICD-10-CM | POA: Insufficient documentation

## 2014-04-13 DIAGNOSIS — I10 Essential (primary) hypertension: Secondary | ICD-10-CM | POA: Insufficient documentation

## 2014-04-13 DIAGNOSIS — I420 Dilated cardiomyopathy: Secondary | ICD-10-CM

## 2014-04-13 DIAGNOSIS — R0600 Dyspnea, unspecified: Secondary | ICD-10-CM

## 2014-04-13 DIAGNOSIS — R9431 Abnormal electrocardiogram [ECG] [EKG]: Secondary | ICD-10-CM

## 2014-04-13 DIAGNOSIS — I4510 Unspecified right bundle-branch block: Secondary | ICD-10-CM

## 2014-04-13 DIAGNOSIS — I2510 Atherosclerotic heart disease of native coronary artery without angina pectoris: Secondary | ICD-10-CM | POA: Insufficient documentation

## 2014-04-13 DIAGNOSIS — R059 Cough, unspecified: Secondary | ICD-10-CM | POA: Insufficient documentation

## 2014-04-13 DIAGNOSIS — R05 Cough: Secondary | ICD-10-CM | POA: Insufficient documentation

## 2014-04-13 HISTORY — PX: LEFT HEART CATHETERIZATION WITH CORONARY ANGIOGRAM: SHX5451

## 2014-04-13 SURGERY — LEFT HEART CATHETERIZATION WITH CORONARY ANGIOGRAM
Anesthesia: LOCAL

## 2014-04-13 MED ORDER — MIDAZOLAM HCL 2 MG/2ML IJ SOLN
INTRAMUSCULAR | Status: AC
  Filled 2014-04-13: qty 2

## 2014-04-13 MED ORDER — FENTANYL CITRATE 0.05 MG/ML IJ SOLN
INTRAMUSCULAR | Status: AC
Start: 2014-04-13 — End: 2014-04-13
  Filled 2014-04-13: qty 2

## 2014-04-13 MED ORDER — SODIUM CHLORIDE 0.9 % IJ SOLN
3.0000 mL | INTRAMUSCULAR | Status: DC | PRN
  Administered 2014-04-13: 3 mL via INTRAVENOUS

## 2014-04-13 MED ORDER — HEPARIN (PORCINE) IN NACL 2-0.9 UNIT/ML-% IJ SOLN
INTRAMUSCULAR | Status: AC
Start: 2014-04-13 — End: 2014-04-13
  Filled 2014-04-13: qty 1000

## 2014-04-13 MED ORDER — DIAZEPAM 5 MG PO TABS
5.0000 mg | ORAL_TABLET | ORAL | Status: AC
  Administered 2014-04-13: 5 mg via ORAL

## 2014-04-13 MED ORDER — HEPARIN SODIUM (PORCINE) 1000 UNIT/ML IJ SOLN
INTRAMUSCULAR | Status: AC
  Filled 2014-04-13: qty 1

## 2014-04-13 MED ORDER — ASPIRIN 81 MG PO CHEW
CHEWABLE_TABLET | ORAL | Status: AC
  Filled 2014-04-13: qty 1

## 2014-04-13 MED ORDER — SODIUM CHLORIDE 0.9 % IJ SOLN: 3.0000 mL | Freq: Two times a day (BID) | INTRAMUSCULAR | Status: DC

## 2014-04-13 MED ORDER — NITROGLYCERIN 0.2 MG/ML ON CALL CATH LAB
INTRAVENOUS | Status: AC
Start: 2014-04-13 — End: 2014-04-13
  Filled 2014-04-13: qty 1

## 2014-04-13 MED ORDER — SODIUM CHLORIDE 0.9 % IV SOLN
1.0000 mL/kg/h | INTRAVENOUS | Status: DC
  Administered 2014-04-13: 1 mL/kg/h via INTRAVENOUS

## 2014-04-13 MED ORDER — SODIUM CHLORIDE 0.9 % IV SOLN: 1.0000 mL/kg/h | INTRAVENOUS | Status: DC

## 2014-04-13 MED ORDER — ASPIRIN 81 MG PO CHEW
81.0000 mg | CHEWABLE_TABLET | ORAL | Status: AC
  Administered 2014-04-13: 81 mg via ORAL

## 2014-04-13 MED ORDER — LIDOCAINE HCL (PF) 1 % IJ SOLN
INTRAMUSCULAR | Status: AC
  Filled 2014-04-13: qty 30

## 2014-04-13 MED ORDER — DIAZEPAM 5 MG PO TABS
ORAL_TABLET | ORAL | Status: AC
  Administered 2014-04-13: 5 mg via ORAL
  Filled 2014-04-13: qty 1

## 2014-04-13 MED ORDER — SODIUM CHLORIDE 0.9 % IV SOLN: 250.0000 mL | INTRAVENOUS | Status: DC | PRN

## 2014-04-13 NOTE — H&P (View-Only) (Signed)
[address] [place], [place]  [address] Phone: [phone] Fax:  [phone]  Date:  04/05/2014   ID:  Peter Kennedy, DOB 07/06/1935, MRN [id number]  PCP:  Marnette Burgess, NP ([facility]) Cardiologist:  Dr. Donato Schultz     History of Present Illness: Peter Kennedy is a 79 y.o. [place] male who was recently evaluated for abnormal ECG (RBBB), uncontrolled HTN, dyspnea and chest pain.  Hyzaar was added for BP control.  Echocardiogram showed EF 25-30%.  Nuclear stress test 02/04/14: This study is abnormal with moderate risk. There is left ventricular dysfunction. There is evidence of an old scar that is large. There is no significant ischemia. The scar affects the inferior wall and the apex in the inferolateral wall primarily. It also affects partially the anterolateral wall.  LV Ejection Fraction: 38%. LV Wall Motion: Hypokinesis of the septum, inferior wall and lateral wall.    His breathing is better. He denies chest pain or syncope. He probably describes NYHA class II symptoms. He denies orthopnea, PND or edema. He does have a chronic cough.  Ready for cath. Did well on his trip.  Recent Labs: 02/08/2014: ALT 16; ALT 16; Creatinine 1.0; HDL Cholesterol 35.70*; Hemoglobin 13.0; LDL (calc) 143*; Potassium 4.3    Wt Readings from Last 3 Encounters:  04/05/14 137 lb (62.143 kg)  02/08/14 144 lb (65.318 kg)  02/03/14 140 lb (63.504 kg)     Past Medical History  Diagnosis Date  . Hypertension   . Coronary artery disease   . SOB (shortness of breath)   . Eye inflamed   . Hx of cardiovascular stress test     Lexiscan Myoview (01/2014):  Inf, apical and IL scar; no ischemia; EF 38%; Mod Risk.    Current Outpatient Prescriptions  Medication Sig Dispense Refill  . albuterol (PROVENTIL HFA;VENTOLIN HFA) 108 (90 BASE) MCG/ACT inhaler Inhale 2 puffs into the lungs every 6 (six) hours as needed for wheezing or shortness of breath.      Marland Kitchen atorvastatin  (LIPITOR) 40 MG tablet Take 1 tablet (40 mg total) by mouth daily.  30 tablet  3  . Fluticasone-Salmeterol (ADVAIR) 250-50 MCG/DOSE AEPB Inhale 1 puff into the lungs 2 (two) times daily.      Marland Kitchen losartan-hydrochlorothiazide (HYZAAR) 50-12.5 MG per tablet Take 1 tablet by mouth daily.      . metoprolol succinate (TOPROL XL) 25 MG 24 hr tablet Take 1 tablet (25 mg total) by mouth daily.  30 tablet  3  . Multiple Vitamins-Minerals (MULTIVITAMIN PO) Take by mouth daily.      . nitroGLYCERIN (NITROSTAT) 0.4 MG SL tablet Place 0.4 mg under the tongue every 5 (five) minutes as needed for chest pain.       No current facility-administered medications for this visit.    Allergies:   Review of patient's allergies indicates no known allergies.   Social History:  The patient  reports that he has quit smoking. He does not have any smokeless tobacco history on file. He reports that he does not drink alcohol or use illicit drugs.   Family History:  The patient's family history is not on file.   ROS:  Please see the history of present illness.      All other systems reviewed and negative.   PHYSICAL EXAM: VS:  BP 120/80  Pulse 76  Ht 5\' 5"  (1.651 m)  Wt 137 lb (62.143 kg)  BMI 22.80 kg/m2 Well  nourished, well developed, in no acute distressThin HEENT: normal Neck: no JVD Cardiac:  distant S1, S2; RRR; no murmur Lungs:  Decreased breath sounds bilaterally, diffuse expiratory wheezes Abd: soft, nontender, no hepatomegaly Ext: no edema2+ radial pulses. Skin: warm and dry Neuro:  CNs 2-12 intact, no focal abnormalities noted  EKG:  NSR, HR 98, IVCD versus LBBB, no change from prior tracing     ASSESSMENT AND PLAN:  1. Dyspnea/cardiomyopathy-newly discovered. Improved. EF 25% on echo, 38% on nuclear with inferior/inferolateral scar. We have discussed cardiac catheterization, risks, benefits including stroke, heart attack, death, renal impairment, bleeding. Right radial artery approach. I would like  to perform left heart catheterization. Continue metoprolol extended release 25 mg once a day. He is not having any active angina, shortness of breath has improved with hypertension control. 2. Hypertension:  Better control. Check CMET. CBC 3. We're going to start atorvastatin 40 mg given his old infarct pattern. Check lipids. ALT in 2 months. 4. Post cath fu  Signed, Kailer Heindel, MD   04/05/2014 9:35 AM   

## 2014-04-13 NOTE — CV Procedure (Signed)
    CARDIAC CATHETERIZATION  PROCEDURE:  Left heart catheterization with selective coronary angiography, left ventriculogram via the radial artery approach.  INDICATIONS:  Newly discovered cardiomyopathy 25%. Dyspnea. No chest pain.   The risks, benefits, and details of the procedure were explained to the patient, including possibilities of stroke, heart attack, death, renal impairment, arterial damage, bleeding.  The patient verbalized understanding and wanted to proceed.  Informed written consent was obtained.  PROCEDURE TECHNIQUE:  Allen's test was performed pre-and post procedure and was normal. The right radial artery site was prepped and draped in a sterile fashion. One percent lidocaine was used for local anesthesia. Using the modified Seldinger technique a 5 French hydrophilic sheath was inserted into the radial artery without difficulty. 3 mg of verapamil was administered via the sheath. A Judkins right #4 catheter with the guidance of a Versicore wire was placed in the right coronary cusp and selectively cannulated the right coronary artery. After traversing the aortic arch, 3000 units of heparin IV was administered. A Judkins left #3.5 catheter was used to selectively cannulate the left main artery. Multiple views with hand injection of Omnipaque were obtained. Catheter a pigtail catheter was used to cross into the left ventricle, hemodynamics were obtained, and a left ventriculogram was performed in the RAO position with power injection. Following the procedure, sheath was removed, patient was hemodynamically stable, hemostasis was maintained with a Terumo T band.   CONTRAST:  Total of 80 ml.    FLOUROSCOPY TIME: 2.8 min.  COMPLICATIONS:  None.    HEMODYNAMICS:  Aortic pressure was 182/78mmHg; LV systolic pressure was ; LVEDP 22 mmHg.  There was no gradient between the left ventricle and aorta.    ANGIOGRAPHIC DATA:    Left main: Branches into LAD and circumflex. No  significant disease.   Left anterior descending (LAD): Proximal/mid LAD stenosis of 80%, hazy calcified plaque. Mid sized mid diagonal branch with minor luminal irregularities.   Circumflex artery (CIRC): High OM/ ramus branch, minor luminal irregularities. 30-40% proximal disease. Mid OM branch 100% proximal/mid occlusion with reconsitution, large vessel.   Right coronary artery (RCA): Diffuse disease proximal/ mid 90%, 80% mid, 95% mid/distal. Large caliber PDA. Dominant vessel.   LEFT VENTRICULOGRAM:  Left ventricular angiogram was done in the 30 RAO projection and revealed decreased left ventricular systolic function severe with an estimated ejection fraction of 25%. There is apical, mid to distal antero/inferior hypokinesis.   IMPRESSIONS:  Severe 3 vessel CAD Severely reduced left ventricular systolic function.  LVEDP 22 mmHg.  Ejection fraction 25%.  RECOMMENDATION:  CABG referral. Since pain free, will see as outpatient. Call has been placed to [facility].

## 2014-04-13 NOTE — Discharge Instructions (Signed)

## 2014-04-13 NOTE — Interval H&P Note (Signed)
History and Physical Interval Note: Translator present.  04/13/2014 9:07 AM  Peter Kennedy  has presented today for surgery, with the diagnosis of cm  The various methods of treatment have been discussed with the patient and family. After consideration of risks, benefits and other options for treatment, the patient has consented to  Procedure(s): LEFT HEART CATHETERIZATION WITH CORONARY ANGIOGRAM (N/A) as a surgical intervention .  The patient's history has been reviewed, patient examined, no change in status, stable for surgery.  I have reviewed the patient's chart and labs.  Questions were answered to the patient's satisfaction.     [employer]

## 2014-04-15 ENCOUNTER — Other Ambulatory Visit: Payer: Self-pay | Admitting: *Deleted

## 2014-04-15 ENCOUNTER — Institutional Professional Consult (permissible substitution) (INDEPENDENT_AMBULATORY_CARE_PROVIDER_SITE_OTHER): Payer: No Typology Code available for payment source | Admitting: Surgery

## 2014-04-15 ENCOUNTER — Encounter: Payer: Self-pay | Admitting: Surgery

## 2014-04-15 VITALS — BP 152/92 | HR 110 | Resp 20 | Ht 65.0 in | Wt 137.0 lb

## 2014-04-15 DIAGNOSIS — I2510 Atherosclerotic heart disease of native coronary artery without angina pectoris: Secondary | ICD-10-CM

## 2014-04-15 NOTE — Progress Notes (Signed)
PCP is No PCP Per Patient Referring Provider is Donato Schultz, MD  Chief Complaint  Patient presents with  . Coronary Artery Disease    Surgical eval for possible CABG, Cardiac Cath 04/13/14, 2D ECHO 02/04/14- Dr Anne Fu    HPI:  The patient is a 79 year old [place] gentleman who speaks no English and is here with his friend, whom he lives with and his friend speaks fluent [place] and can translate for him. He has a history of uncontrolled HTN and presented with exertional shortness of breath and mild chest pain. He was found to have an abnormal ECG with a RBBB with nonspecific ST-T changes. He underwent a myocardial perfusion scan on 02/03/2014 showing and EF of 38% with evidence of old scar involving the inferior wall and apex and part of the anterolateral wall. There was hypokinesis of the septum, inferior and lateral walls. There were no symptoms or ECG changes. It was a moderate risk study so he had a cardiac cath 2 days ago showing severe 3-vessel disease with an EF of 25% with a LVEDP of 22.  Past Medical History  Diagnosis Date  . Hypertension   . Coronary artery disease   . SOB (shortness of breath)   . Eye inflamed   . Hx of cardiovascular stress test     Lexiscan Myoview (01/2014):  Inf, apical and IL scar; no ischemia; EF 38%; Mod Risk.    History reviewed. No pertinent past surgical history.  History reviewed. No pertinent family history.  Social History History  Substance Use Topics  . Smoking status: Former Games developer  . Smokeless tobacco: Not on file  . Alcohol Use: No  Has been in the [place] since Nov 2014. His family all moved back to [place] and he lives here with a friend.  Current Outpatient Prescriptions  Medication Sig Dispense Refill  . acetaminophen (TYLENOL) 325 MG tablet Take 325-650 mg by mouth every 6 (six) hours as needed for mild pain or headache.      . albuterol (PROVENTIL HFA;VENTOLIN HFA) 108 (90 BASE) MCG/ACT inhaler Inhale 2 puffs into the lungs  every 6 (six) hours as needed for wheezing or shortness of breath.      Marland Kitchen atorvastatin (LIPITOR) 40 MG tablet Take 1 tablet (40 mg total) by mouth daily.  30 tablet  3  . Fluticasone-Salmeterol (ADVAIR) 250-50 MCG/DOSE AEPB Inhale 1 puff into the lungs 2 (two) times daily.      Marland Kitchen losartan-hydrochlorothiazide (HYZAAR) 50-12.5 MG per tablet Take 1 tablet by mouth daily.      . metoprolol succinate (TOPROL XL) 25 MG 24 hr tablet Take 1 tablet (25 mg total) by mouth daily.  30 tablet  3  . Multiple Vitamins-Minerals (MULTIVITAMIN PO) Take by mouth daily.      . nitroGLYCERIN (NITROSTAT) 0.4 MG SL tablet Place 0.4 mg under the tongue every 5 (five) minutes as needed for chest pain.       No current facility-administered medications for this visit.    No Known Allergies  Review of Systems  Constitutional: Negative for fever, diaphoresis, activity change, appetite change, fatigue and unexpected weight change.  HENT: Negative.   Eyes: Negative.   Respiratory: Positive for shortness of breath.   Cardiovascular: Positive for chest pain. Negative for palpitations and leg swelling.  Gastrointestinal: Negative.   Endocrine: Negative.   Genitourinary: Negative.   Musculoskeletal: Negative.   Skin: Negative.   Allergic/Immunologic: Negative.   Neurological: Negative.   Hematological:  Negative.   Psychiatric/Behavioral: Negative.     BP 152/92  Pulse 110  Resp 20  Ht 5\' 5"  (1.651 m)  Wt 137 lb (62.143 kg)  BMI 22.80 kg/m2  SpO2 96% Physical Exam  Constitutional: He is oriented to person, place, and time. He appears well-developed and well-nourished. No distress.  HENT:  Head: Normocephalic and atraumatic.  Mouth/Throat: Oropharynx is clear and moist.  Poor dentition  Eyes: EOM are normal. Pupils are equal, round, and reactive to light.  Neck: Normal range of motion. Neck supple. No JVD present. No thyromegaly present.  Cardiovascular: Normal rate, regular rhythm and normal heart sounds.    No murmur heard. Pulmonary/Chest: Effort normal and breath sounds normal. No respiratory distress. He has no rales.  Abdominal: Soft. Bowel sounds are normal. He exhibits no distension and no mass. There is no tenderness.  Musculoskeletal: Normal range of motion. He exhibits no edema and no tenderness.  Neurological: He is alert and oriented to person, place, and time. He has normal strength. No cranial nerve deficit or sensory deficit.  Skin: Skin is warm and dry.  Psychiatric: He has a normal mood and affect.    Diagnostic Tests:  *[facility]* [address], [address] [place], [place] [address] [phone]  ------------------------------------------------------------ Echocardiography  Patient: Peter Kennedy, Peter Kennedy MR #: [id number] Study Date: 02/04/2014 Gender: M Age: [age] Height: 165.1cm Weight: 63.5kg BSA: 1.2574m^2 Pt. Status: Room:  ATTENDING Cira RueSkains, Mark ORDERING Skains, Mark REFERRING Donato SchultzSkains, Mark SONOGRAPHER Clearence Pedammie Crouch, RCS PERFORMING [facility], Outpatient cc:  ------------------------------------------------------------ LV EF: 25% - 30%  ------------------------------------------------------------ Indications: 786.05 Dyspnea.  ------------------------------------------------------------ History: PMH: Chest pain. Risk factors: Hypertension.  ------------------------------------------------------------ Study Conclusions  - Left ventricle: The cavity size was mildly dilated. Wall thickness was normal. Systolic function was severely reduced. The estimated ejection fraction was in the range of 25% to 30%. There was fusion of early and atrial contributions to ventricular filling. - Aortic valve: Mild regurgitation. - Mitral valve: Mild to moderate regurgitation directed eccentrically.  ------------------------------------------------------------ Labs, prior tests, procedures, and surgery: ECG. Abnormal. Echocardiography. M-mode, complete  2D, spectral Doppler, and color Doppler. Height: Height: 165.1cm. Height: 65in. Weight: Weight: 63.5kg. Weight: 139.7lb. Body mass index: BMI: 23.3kg/m^2. Body surface area: BSA: 1.9374m^2. Blood pressure: 130/82. Patient status: Outpatient. Location: Echo laboratory.  ------------------------------------------------------------  ------------------------------------------------------------ Left ventricle: The cavity size was mildly dilated. Wall thickness was normal. Systolic function was severely reduced. The estimated ejection fraction was in the range of 25% to 30%. There was fusion of early and atrial contributions to ventricular filling.  ------------------------------------------------------------ Aortic valve: Doppler: Mild regurgitation. VTI ratio of LVOT to aortic valve: 0.7. Peak velocity ratio of LVOT to aortic valve: 0.75. Mean gradient: 4mm Hg (S).  ------------------------------------------------------------ Aorta: Ascending aorta: The ascending aorta was mildly dilated.  ------------------------------------------------------------ Mitral valve: Structurally normal valve. Doppler: Mild to moderate regurgitation directed eccentrically. Valve area by pressure half-time: 5.24cm^2. Indexed valve area by pressure half-time: 3.08cm^2/m^2. Mean gradient: 4mm Hg (D). Peak gradient: 10mm Hg (D).  ------------------------------------------------------------ Left atrium: LA volume/ BSA=21.2 ml/m2 The atrium was normal in size.  ------------------------------------------------------------ Right ventricle: The cavity size was normal. Systolic function was normal.  ------------------------------------------------------------ Ventricular septum: There is septal bounce due to the BBB  ------------------------------------------------------------ Tricuspid valve: Doppler: Trivial regurgitation.  ------------------------------------------------------------ Pulmonary artery:  Systolic pressure was within the normal range.  ------------------------------------------------------------ Right atrium: The atrium was normal in size.  ------------------------------------------------------------ Pericardium: There was no pericardial effusion.  ------------------------------------------------------------ Systemic veins: Inferior vena cava: The vessel was normal  in size; the respirophasic diameter changes were in the normal range (= 50%); findings are consistent with normal central venous pressure. Diameter: 15mm.  ------------------------------------------------------------ Pre bypass:  Post bypass:  ------------------------------------------------------------  2D measurements Normal Doppler measurements Normal IVC Main pulmonary Diam 15 mm ------ artery Left ventricle Pressure, 30 mm Hg =30 LVID ED, 53.8 mm 43-52 S chord, Left ventricle PLAX Ea, lat 11 cm/s ------ LVID ES, 48.7 mm 23-38 ann, tiss chord, DP PLAX E/Ea, lat 10.2 ------ FS, chord, 9 % >29 ann, tiss 7 PLAX DP LVPW, ED 8.21 mm ------ Ea, med 12.4 cm/s ------ IVS/LVPW 1.13 <1.3 ann, tiss ratio, ED DP Vol ED, 100 ml ------ E/Ea, med 9.11 ------ MOD1 ann, tiss Vol ES, 58 ml ------ DP MOD1 LVOT EF, MOD1 42 % ------ Peak vel, 97.8 cm/s ------ Vol index, 59 ml/m^2 ------ S ED, MOD1 VTI, S 18.6 cm ------ Vol index, 34 ml/m^2 ------ Aortic valve ES, MOD1 Peak vel, 130 cm/s ------ Vol ED, 103 ml ------ S MOD2 Mean vel, 97.8 cm/s ------ Vol ES, 63 ml ------ S MOD2 VTI, S 26.5 cm ------ EF, MOD2 39 % ------ Mean 4 mm Hg ------ Stroke 40 ml ------ gradient, vol, MOD2 S Vol index, 61 ml/m^2 ------ VTI ratio 0.7 ------ ED, MOD2 LVOT/AV Vol index, 37 ml/m^2 ------ Peak vel 0.75 ------ ES, MOD2 ratio, Stroke 23.5 ml/m^2 ------ LVOT/AV index, Regurg PHT 320 ms ------ MOD2 Mitral valve Ventricular septum Peak E vel 113 cm/s ------ IVS, ED 9.28 mm ------ Mean vel, 83.3 cm/s ------ Aorta  D Root diam, 35 mm ------ Decelerati 102 ms 150-23 ED on time 0 Left atrium Pressure 42 ms ------ AP dim 40 mm ------ half-time AP dim 2.35 cm/m^2 <2.2 Mean 4 mm Hg ------ index gradient, Right ventricle D RVID ED, 14.7 mm 19-38 Peak 10 mm Hg ------ PLAX gradient, D Area (PHT) 5.24 cm^2 ------ Area index 3.08 cm^2/m ------ (PHT) ^2 Annulus 22.2 cm ------ VTI Tricuspid valve Regurg 259 cm/s ------ peak vel Peak RV-RA 27 mm Hg ------ gradient, S Systemic veins Estimated 3 mm Hg ------ CVP Right ventricle Pressure, 30 mm Hg <30 S Sa vel, 18.5 cm/s ------ lat ann, tiss DP  ------------------------------------------------------------ Prepared and Electronically Authenticated by  Kristeen Miss [id number]:[id number]    CARDIAC CATHETERIZATION  PROCEDURE: Left heart catheterization with selective coronary angiography, left ventriculogram via the radial artery approach.  INDICATIONS: Newly discovered cardiomyopathy 25%. Dyspnea. No chest pain.  The risks, benefits, and details of the procedure were explained to the patient, including possibilities of stroke, heart attack, death, renal impairment, arterial damage, bleeding. The patient verbalized understanding and wanted to proceed. Informed written consent was obtained.  PROCEDURE TECHNIQUE: Allen's test was performed pre-and post procedure and was normal. The right radial artery site was prepped and draped in a sterile fashion. One percent lidocaine was used for local anesthesia. Using the modified Seldinger technique a 5 French hydrophilic sheath was inserted into the radial artery without difficulty. 3 mg of verapamil was administered via the sheath. A Judkins right #4 catheter with the guidance of a Versicore wire was placed in the right coronary cusp and selectively cannulated the right coronary artery. After traversing the aortic arch, 3000 units of heparin IV was administered. A Judkins left #3.5 catheter was used to  selectively cannulate the left main artery. Multiple views with hand injection of Omnipaque were obtained. Catheter a pigtail catheter was used to cross into the left ventricle, hemodynamics were obtained, and  a left ventriculogram was performed in the RAO position with power injection. Following the procedure, sheath was removed, patient was hemodynamically stable, hemostasis was maintained with a Terumo T band.  CONTRAST: Total of 80 ml.  FLOUROSCOPY TIME: 2.8 min.  COMPLICATIONS: None.  HEMODYNAMICS: Aortic pressure was 182/13mmHg; LV systolic pressure was ; LVEDP 22 mmHg. There was no gradient between the left ventricle and aorta.  ANGIOGRAPHIC DATA:  Left main: Branches into LAD and circumflex. No significant disease.  Left anterior descending (LAD): Proximal/mid LAD stenosis of 80%, hazy calcified plaque. Mid sized mid diagonal branch with minor luminal irregularities.  Circumflex artery (CIRC): High OM/ ramus branch, minor luminal irregularities. 30-40% proximal disease. Mid OM branch 100% proximal/mid occlusion with reconsitution, large vessel.  Right coronary artery (RCA): Diffuse disease proximal/ mid 90%, 80% mid, 95% mid/distal. Large caliber PDA. Dominant vessel.  LEFT VENTRICULOGRAM: Left ventricular angiogram was done in the 30 RAO projection and revealed decreased left ventricular systolic function severe with an estimated ejection fraction of 25%. There is apical, mid to distal antero/inferior hypokinesis.  IMPRESSIONS:  1. Severe 3 vessel CAD 2. Severely reduced left ventricular systolic function. LVEDP 22 mmHg. Ejection fraction 25%. RECOMMENDATION: CABG referral. Since pain free, will see as outpatient. Call has been placed to [facility].    Impression:  He has severe 3-vessel CAD with marked LV dysfunction. His myocardial perfusion scan showed a large fixed defect but no ischemia. I doubt this is accurate given the degree of stenosis and the territories involved. I think  CABG is the best treatment for him to improve his EF and quality of life and to prevent further ischemia and infarction. I discussed the operative procedure with the patient and family including alternatives, benefits and risks; including but not limited to bleeding, blood transfusion, infection, stroke, myocardial infarction, graft failure, heart block requiring a permanent pacemaker, organ dysfunction, and death.  Peter Dibbles understands and agrees to proceed.  We will schedule surgery for Monday 04/19/2014.  Plan:  CABG surgery Monday, 04/19/2014.

## 2014-04-16 ENCOUNTER — Encounter (HOSPITAL_COMMUNITY)
Admission: RE | Admit: 2014-04-16 | Discharge: 2014-04-16 | Disposition: A | Discharge status: Home / Self Care | Payer: No Typology Code available for payment source | Source: Ambulatory Visit | Attending: Surgery | Admitting: Surgery

## 2014-04-16 ENCOUNTER — Ambulatory Visit (HOSPITAL_COMMUNITY)
Admission: RE | Admit: 2014-04-16 | Discharge: 2014-04-16 | Disposition: A | Discharge status: Home / Self Care | Payer: No Typology Code available for payment source | Source: Ambulatory Visit | Attending: Surgery | Admitting: Surgery

## 2014-04-16 ENCOUNTER — Encounter (HOSPITAL_COMMUNITY): Payer: Self-pay

## 2014-04-16 VITALS — BP 150/90 | HR 99 | Temp 97.9°F | Resp 20 | Ht 66.0 in | Wt 136.9 lb

## 2014-04-16 DIAGNOSIS — I2510 Atherosclerotic heart disease of native coronary artery without angina pectoris: Secondary | ICD-10-CM | POA: Insufficient documentation

## 2014-04-16 DIAGNOSIS — Z01810 Encounter for preprocedural cardiovascular examination: Secondary | ICD-10-CM

## 2014-04-16 DIAGNOSIS — Z01818 Encounter for other preprocedural examination: Secondary | ICD-10-CM | POA: Insufficient documentation

## 2014-04-16 DIAGNOSIS — I6529 Occlusion and stenosis of unspecified carotid artery: Secondary | ICD-10-CM | POA: Insufficient documentation

## 2014-04-16 HISTORY — DX: Gastro-esophageal reflux disease without esophagitis: K21.9

## 2014-04-16 LAB — COMPREHENSIVE METABOLIC PANEL
ALBUMIN: 4.1 g/dL (ref 3.5–5.2)
ALT: 15 U/L (ref 0–53)
AST: 23 U/L (ref 0–37)
Alkaline Phosphatase: 80 U/L (ref 39–117)
BILIRUBIN TOTAL: 0.6 mg/dL (ref 0.3–1.2)
BUN: 19 mg/dL (ref 6–23)
CO2: 17 mEq/L — ABNORMAL LOW (ref 19–32)
CREATININE: 0.9 mg/dL (ref 0.50–1.35)
Calcium: 9.6 mg/dL (ref 8.4–10.5)
Chloride: 101 mEq/L (ref 96–112)
GFR calc Af Amer: >90 mL/min (ref >90)
GFR calc non Af Amer: 80 mL/min — ABNORMAL LOW (ref >90)
GLUCOSE: 121 mg/dL — AB (ref 70–99)
POTASSIUM: 4.4 meq/L (ref 3.7–5.3)
Sodium: 137 mEq/L (ref 137–147)
TOTAL PROTEIN: 8.4 g/dL — AB (ref 6.0–8.3)

## 2014-04-16 LAB — CBC
HCT: 42.6 % (ref 39.0–52.0)
Hemoglobin: 14.5 g/dL (ref 13.0–17.0)
MCH: 29.9 pg (ref 26.0–34.0)
MCHC: 34 g/dL (ref 30.0–36.0)
MCV: 87.8 fL (ref 78.0–100.0)
Platelets: 265 10*3/uL (ref 150–400)
RBC: 4.85 MIL/uL (ref 4.22–5.81)
RDW: 13.5 % (ref 11.5–15.5)
WBC: 10.9 10*3/uL — ABNORMAL HIGH (ref 4.0–10.5)

## 2014-04-16 LAB — BLOOD GAS, ARTERIAL
ACID-BASE DEFICIT: 3.4 mmol/L — AB (ref 0.0–2.0)
BICARBONATE: 20.3 meq/L (ref 20.0–24.0)
Drawn by: 344381
FIO2: 0.21 %
O2 SAT: 97.4 %
PATIENT TEMPERATURE: 98.6
PH ART: 7.422 (ref 7.350–7.450)
TCO2: 21.2 mmol/L (ref 0–100)
pCO2 arterial: 31.7 mmHg — ABNORMAL LOW (ref 35.0–45.0)
pO2, Arterial: 92.7 mmHg (ref 80.0–100.0)

## 2014-04-16 LAB — URINALYSIS, ROUTINE W REFLEX MICROSCOPIC
Bilirubin Urine: NEGATIVE
GLUCOSE, UA: NEGATIVE mg/dL
HGB URINE DIPSTICK: NEGATIVE
Ketones, ur: NEGATIVE mg/dL
LEUKOCYTES UA: NEGATIVE
Nitrite: NEGATIVE
Protein, ur: NEGATIVE mg/dL
Specific Gravity, Urine: 1.014 (ref 1.005–1.030)
Urobilinogen, UA: 0.2 mg/dL (ref 0.0–1.0)
pH: 5.5 (ref 5.0–8.0)

## 2014-04-16 LAB — PROTIME-INR
INR: 1.07 (ref 0.00–1.49)
Prothrombin Time: 13.7 seconds (ref 11.6–15.2)

## 2014-04-16 LAB — TYPE AND SCREEN
ABO/RH(D): B POS
Antibody Screen: NEGATIVE

## 2014-04-16 LAB — HEMOGLOBIN A1C
HEMOGLOBIN A1C: 6.4 % — AB (ref <5.7)
MEAN PLASMA GLUCOSE: 137 mg/dL — AB (ref <117)

## 2014-04-16 LAB — SURGICAL PCR SCREEN
MRSA, PCR: NEGATIVE
Staphylococcus aureus: NEGATIVE

## 2014-04-16 LAB — APTT: aPTT: 27 seconds (ref 24–37)

## 2014-04-16 LAB — ABO/RH: ABO/RH(D): B POS

## 2014-04-16 MED ORDER — ALBUTEROL SULFATE (2.5 MG/3ML) 0.083% IN NEBU
2.5000 mg | INHALATION_SOLUTION | Freq: Once | RESPIRATORY_TRACT | Status: AC
  Administered 2014-04-16: 2.5 mg via RESPIRATORY_TRACT

## 2014-04-16 NOTE — Pre-Procedure Instructions (Signed)
Vaden Matlick  04/16/2014   Your procedure is scheduled on:  04-19-2014   Monday   Report to [facility] Admitting at 5:30 AM.   Call this number if you have problems the morning of surgery: [phone]   Remember:   Do not eat food or drink liquids after midnight.    Take these medicines the morning of surgery with A SIP OF WATER: proventil inhaler as needed,,advair,metoprolol(Toprol XR)   Do not wear jewelry, make-up or nail polish.  Do not wear lotions, powders, or perfumes. You may wear deodorant.  Do not shave 48 hours prior to surgery. Men may shave face and neck.  Do not bring valuables to the hospital.  [facility] is not responsible for any belongings or valuables.               Contacts, dentures or bridgework may not be worn into surgery.   Leave suitcase in the car. After surgery it may be brought to your room.  For patients admitted to the hospital, discharge time is determined by your treatment team.               Patients discharged the day of surgery will not be allowed to drive home.    Special Instructions: See attached sheet for instructions on CHG shower/bath   Please read over the following fact sheets that you were given: Pain Booklet, Coughing and Deep Breathing, Blood Transfusion Information and Surgical Site Infection Prevention

## 2014-04-16 NOTE — Progress Notes (Signed)
VASCULAR LAB PRELIMINARY  PRELIMINARY  PRELIMINARY  PRELIMINARY  Pre-op Cardiac Surgery  Carotid Findings:  Bilateral:  1-39% ICA stenosis.  Vertebral artery flow is antegrade.      Upper Extremity Right Left  Brachial Pressures    Triphasic     Triphasic   Radial Waveforms Triphasic  Triphasic   Ulnar Waveforms Triphasic  Triphasic   Palmar Arch (Allen's Test) Doppler obilerates with radial compression, normal with ulnar compression. Normal with both radial and ulnar compression      Lower  Extremity Right Left  Dorsalis Pedis    Anterior Tibial Palpable x 4   Posterior Tibial    Ankle/Brachial Indices      Gara Kroner, RVT 04/16/2014, 4:40 PM

## 2014-04-18 MED ORDER — VANCOMYCIN HCL 10 G IV SOLR
1250.0000 mg | INTRAVENOUS | Status: AC
  Administered 2014-04-19: 1250 mg via INTRAVENOUS
  Filled 2014-04-18: qty 1250

## 2014-04-18 MED ORDER — SODIUM CHLORIDE 0.9 % IV SOLN
INTRAVENOUS | Status: DC
  Filled 2014-04-18: qty 30

## 2014-04-18 MED ORDER — CEFUROXIME SODIUM 750 MG IJ SOLR
750.0000 mg | INTRAMUSCULAR | Status: DC
Start: 2014-04-19 — End: 2014-04-19
  Filled 2014-04-18: qty 750

## 2014-04-18 MED ORDER — DOPAMINE-DEXTROSE 3.2-5 MG/ML-% IV SOLN
2.0000 ug/kg/min | INTRAVENOUS | Status: DC
  Filled 2014-04-18: qty 250

## 2014-04-18 MED ORDER — SODIUM CHLORIDE 0.9 % IV SOLN
INTRAVENOUS | Status: AC
  Administered 2014-04-19: 69.8 mL/h via INTRAVENOUS
  Filled 2014-04-18: qty 40

## 2014-04-18 MED ORDER — POTASSIUM CHLORIDE 2 MEQ/ML IV SOLN
80.0000 meq | INTRAVENOUS | Status: DC
  Filled 2014-04-18: qty 40

## 2014-04-18 MED ORDER — SODIUM CHLORIDE 0.9 % IV SOLN
INTRAVENOUS | Status: AC
  Administered 2014-04-19: 1.7 [IU]/h via INTRAVENOUS
  Filled 2014-04-18: qty 1

## 2014-04-18 MED ORDER — MAGNESIUM SULFATE 50 % IJ SOLN
40.0000 meq | INTRAMUSCULAR | Status: DC
  Filled 2014-04-18: qty 10

## 2014-04-18 MED ORDER — METOPROLOL TARTRATE 12.5 MG HALF TABLET
12.5000 mg | ORAL_TABLET | Freq: Once | ORAL | Status: AC
  Administered 2014-04-19: 12.5 mg via ORAL
  Filled 2014-04-18: qty 1

## 2014-04-18 MED ORDER — DEXMEDETOMIDINE HCL IN NACL 400 MCG/100ML IV SOLN
0.1000 ug/kg/h | INTRAVENOUS | Status: AC
  Administered 2014-04-19: 0.2 ug/kg/h via INTRAVENOUS
  Filled 2014-04-18: qty 100

## 2014-04-18 MED ORDER — PHENYLEPHRINE HCL 10 MG/ML IJ SOLN
30.0000 ug/min | INTRAVENOUS | Status: AC
  Administered 2014-04-19: 30 ug/min via INTRAVENOUS
  Filled 2014-04-18: qty 2

## 2014-04-18 MED ORDER — NITROGLYCERIN IN D5W 200-5 MCG/ML-% IV SOLN
2.0000 ug/min | INTRAVENOUS | Status: AC
Start: 2014-04-19 — End: 2014-04-19
  Administered 2014-04-19: 5 ug/min via INTRAVENOUS
  Filled 2014-04-18: qty 250

## 2014-04-18 MED ORDER — CHLORHEXIDINE GLUCONATE 4 % EX LIQD
30.0000 mL | CUTANEOUS | Status: DC
  Filled 2014-04-18: qty 30

## 2014-04-18 MED ORDER — DEXTROSE 5 % IV SOLN
1.5000 g | INTRAVENOUS | Status: AC
  Administered 2014-04-19: 1.5 g via INTRAVENOUS
  Administered 2014-04-19: .75 g via INTRAVENOUS
  Filled 2014-04-18: qty 1.5

## 2014-04-18 MED ORDER — PAPAVERINE HCL 30 MG/ML IJ SOLN
INTRAMUSCULAR | Status: AC
  Administered 2014-04-19: 07:00:00
  Filled 2014-04-18: qty 2.5

## 2014-04-18 MED ORDER — EPINEPHRINE HCL 1 MG/ML IJ SOLN
0.5000 ug/min | INTRAVENOUS | Status: DC
  Filled 2014-04-18: qty 4

## 2014-04-19 ENCOUNTER — Inpatient Hospital Stay (HOSPITAL_COMMUNITY)
Admission: RE | Admit: 2014-04-19 | Discharge: 2014-04-25 | DRG: 236 | Disposition: A | Discharge status: Home / Self Care | Payer: No Typology Code available for payment source | Source: Ambulatory Visit | Attending: Surgery | Admitting: Surgery

## 2014-04-19 ENCOUNTER — Encounter (HOSPITAL_COMMUNITY): Payer: Self-pay | Admitting: Certified Registered"

## 2014-04-19 ENCOUNTER — Inpatient Hospital Stay (HOSPITAL_COMMUNITY): Payer: No Typology Code available for payment source | Admitting: Certified Registered"

## 2014-04-19 ENCOUNTER — Inpatient Hospital Stay (HOSPITAL_COMMUNITY): Payer: No Typology Code available for payment source

## 2014-04-19 ENCOUNTER — Encounter (HOSPITAL_COMMUNITY)
Admission: RE | Disposition: A | Discharge status: Home / Self Care | Payer: No Typology Code available for payment source | Source: Ambulatory Visit | Attending: Surgery

## 2014-04-19 ENCOUNTER — Encounter (HOSPITAL_COMMUNITY): Payer: MEDICAID | Admitting: Certified Registered"

## 2014-04-19 DIAGNOSIS — R079 Chest pain, unspecified: Secondary | ICD-10-CM | POA: Diagnosis present

## 2014-04-19 DIAGNOSIS — K219 Gastro-esophageal reflux disease without esophagitis: Secondary | ICD-10-CM | POA: Diagnosis present

## 2014-04-19 DIAGNOSIS — D62 Acute posthemorrhagic anemia: Secondary | ICD-10-CM | POA: Diagnosis not present

## 2014-04-19 DIAGNOSIS — I4510 Unspecified right bundle-branch block: Secondary | ICD-10-CM | POA: Diagnosis present

## 2014-04-19 DIAGNOSIS — I420 Dilated cardiomyopathy: Secondary | ICD-10-CM

## 2014-04-19 DIAGNOSIS — I359 Nonrheumatic aortic valve disorder, unspecified: Secondary | ICD-10-CM | POA: Diagnosis present

## 2014-04-19 DIAGNOSIS — I059 Rheumatic mitral valve disease, unspecified: Secondary | ICD-10-CM | POA: Diagnosis present

## 2014-04-19 DIAGNOSIS — Z951 Presence of aortocoronary bypass graft: Secondary | ICD-10-CM

## 2014-04-19 DIAGNOSIS — I2510 Atherosclerotic heart disease of native coronary artery without angina pectoris: Secondary | ICD-10-CM

## 2014-04-19 DIAGNOSIS — R0602 Shortness of breath: Secondary | ICD-10-CM | POA: Diagnosis present

## 2014-04-19 DIAGNOSIS — K5900 Constipation, unspecified: Secondary | ICD-10-CM | POA: Diagnosis not present

## 2014-04-19 DIAGNOSIS — E119 Type 2 diabetes mellitus without complications: Secondary | ICD-10-CM | POA: Diagnosis present

## 2014-04-19 DIAGNOSIS — Z87891 Personal history of nicotine dependence: Secondary | ICD-10-CM

## 2014-04-19 DIAGNOSIS — I10 Essential (primary) hypertension: Secondary | ICD-10-CM | POA: Diagnosis present

## 2014-04-19 DIAGNOSIS — N289 Disorder of kidney and ureter, unspecified: Secondary | ICD-10-CM | POA: Diagnosis not present

## 2014-04-19 DIAGNOSIS — I428 Other cardiomyopathies: Secondary | ICD-10-CM | POA: Diagnosis present

## 2014-04-19 HISTORY — PX: CORONARY ARTERY BYPASS GRAFT: SHX141

## 2014-04-19 HISTORY — PX: INTRAOPERATIVE TRANSESOPHAGEAL ECHOCARDIOGRAM: SHX5062

## 2014-04-19 LAB — POCT I-STAT 3, ART BLOOD GAS (G3+)
ACID-BASE DEFICIT: 1 mmol/L (ref 0.0–2.0)
ACID-BASE DEFICIT: 1 mmol/L (ref 0.0–2.0)
Acid-Base Excess: 1 mmol/L (ref 0.0–2.0)
Acid-base deficit: 2 mmol/L (ref 0.0–2.0)
BICARBONATE: 23.9 meq/L (ref 20.0–24.0)
BICARBONATE: 25.8 meq/L — AB (ref 20.0–24.0)
BICARBONATE: 24.6 meq/L — AB (ref 20.0–24.0)
Bicarbonate: 23.4 mEq/L (ref 20.0–24.0)
Bicarbonate: 26.3 mEq/L — ABNORMAL HIGH (ref 20.0–24.0)
Bicarbonate: 25.3 mEq/L — ABNORMAL HIGH (ref 20.0–24.0)
O2 SAT: 98 %
O2 Saturation: 100 %
O2 Saturation: 100 %
O2 Saturation: 100 %
O2 Saturation: 99 %
O2 Saturation: 95 %
PCO2 ART: 45.4 mmHg — AB (ref 35.0–45.0)
PCO2 ART: 43.8 mmHg (ref 35.0–45.0)
PH ART: 7.377 (ref 7.350–7.450)
PO2 ART: 134 mmHg — AB (ref 80.0–100.0)
Patient temperature: 36.4
Patient temperature: 37.3
TCO2: 25 mmol/L (ref 0–100)
TCO2: 27 mmol/L (ref 0–100)
TCO2: 25 mmol/L (ref 0–100)
TCO2: 28 mmol/L (ref 0–100)
TCO2: 26 mmol/L (ref 0–100)
TCO2: 27 mmol/L (ref 0–100)
pCO2 arterial: 41.6 mmHg (ref 35.0–45.0)
pCO2 arterial: 39.5 mmHg (ref 35.0–45.0)
pCO2 arterial: 42.7 mmHg (ref 35.0–45.0)
pCO2 arterial: 44.1 mmHg (ref 35.0–45.0)
pH, Arterial: 7.368 (ref 7.350–7.450)
pH, Arterial: 7.363 (ref 7.350–7.450)
pH, Arterial: 7.386 (ref 7.350–7.450)
pH, Arterial: 7.369 (ref 7.350–7.450)
pH, Arterial: 7.369 (ref 7.350–7.450)
pO2, Arterial: 467 mmHg — ABNORMAL HIGH (ref 80.0–100.0)
pO2, Arterial: 417 mmHg — ABNORMAL HIGH (ref 80.0–100.0)
pO2, Arterial: 310 mmHg — ABNORMAL HIGH (ref 80.0–100.0)
pO2, Arterial: 79 mmHg — ABNORMAL LOW (ref 80.0–100.0)
pO2, Arterial: 112 mmHg — ABNORMAL HIGH (ref 80.0–100.0)

## 2014-04-19 LAB — CBC
HCT: 28.3 % — ABNORMAL LOW (ref 39.0–52.0)
HCT: 28 % — ABNORMAL LOW (ref 39.0–52.0)
HEMOGLOBIN: 9.6 g/dL — AB (ref 13.0–17.0)
HEMOGLOBIN: 9.4 g/dL — AB (ref 13.0–17.0)
MCH: 29.8 pg (ref 26.0–34.0)
MCH: 29.8 pg (ref 26.0–34.0)
MCHC: 33.9 g/dL (ref 30.0–36.0)
MCHC: 33.6 g/dL (ref 30.0–36.0)
MCV: 87.9 fL (ref 78.0–100.0)
MCV: 88.9 fL (ref 78.0–100.0)
PLATELETS: 139 10*3/uL — AB (ref 150–400)
Platelets: 147 10*3/uL — ABNORMAL LOW (ref 150–400)
RBC: 3.22 MIL/uL — AB (ref 4.22–5.81)
RBC: 3.15 MIL/uL — ABNORMAL LOW (ref 4.22–5.81)
RDW: 13.4 % (ref 11.5–15.5)
RDW: 13.6 % (ref 11.5–15.5)
WBC: 13.5 10*3/uL — AB (ref 4.0–10.5)
WBC: 19 10*3/uL — ABNORMAL HIGH (ref 4.0–10.5)

## 2014-04-19 LAB — GLUCOSE, CAPILLARY
GLUCOSE-CAPILLARY: 95 mg/dL (ref 70–99)
GLUCOSE-CAPILLARY: 148 mg/dL — AB (ref 70–99)
GLUCOSE-CAPILLARY: 139 mg/dL — AB (ref 70–99)
GLUCOSE-CAPILLARY: 107 mg/dL — AB (ref 70–99)
GLUCOSE-CAPILLARY: 90 mg/dL (ref 70–99)
Glucose-Capillary: 100 mg/dL — ABNORMAL HIGH (ref 70–99)
Glucose-Capillary: 122 mg/dL — ABNORMAL HIGH (ref 70–99)
Glucose-Capillary: 135 mg/dL — ABNORMAL HIGH (ref 70–99)
Glucose-Capillary: 145 mg/dL — ABNORMAL HIGH (ref 70–99)
Glucose-Capillary: 148 mg/dL — ABNORMAL HIGH (ref 70–99)
Glucose-Capillary: 162 mg/dL — ABNORMAL HIGH (ref 70–99)
Glucose-Capillary: 59 mg/dL — ABNORMAL LOW (ref 70–99)

## 2014-04-19 LAB — POCT I-STAT 4, (NA,K, GLUC, HGB,HCT)
GLUCOSE: 106 mg/dL — AB (ref 70–99)
Glucose, Bld: 115 mg/dL — ABNORMAL HIGH (ref 70–99)
Glucose, Bld: 109 mg/dL — ABNORMAL HIGH (ref 70–99)
Glucose, Bld: 90 mg/dL (ref 70–99)
Glucose, Bld: 116 mg/dL — ABNORMAL HIGH (ref 70–99)
Glucose, Bld: 94 mg/dL (ref 70–99)
HCT: 33 % — ABNORMAL LOW (ref 39.0–52.0)
HCT: 25 % — ABNORMAL LOW (ref 39.0–52.0)
HCT: 32 % — ABNORMAL LOW (ref 39.0–52.0)
HCT: 20 % — ABNORMAL LOW (ref 39.0–52.0)
HCT: 29 % — ABNORMAL LOW (ref 39.0–52.0)
HEMATOCRIT: 26 % — AB (ref 39.0–52.0)
HEMOGLOBIN: 8.5 g/dL — AB (ref 13.0–17.0)
HEMOGLOBIN: 6.8 g/dL — AB (ref 13.0–17.0)
HEMOGLOBIN: 8.8 g/dL — AB (ref 13.0–17.0)
HEMOGLOBIN: 9.9 g/dL — AB (ref 13.0–17.0)
Hemoglobin: 11.2 g/dL — ABNORMAL LOW (ref 13.0–17.0)
Hemoglobin: 10.9 g/dL — ABNORMAL LOW (ref 13.0–17.0)
POTASSIUM: 5 meq/L (ref 3.7–5.3)
POTASSIUM: 6.5 meq/L — AB (ref 3.7–5.3)
POTASSIUM: 4.4 meq/L (ref 3.7–5.3)
Potassium: 4.2 mEq/L (ref 3.7–5.3)
Potassium: 4.8 mEq/L (ref 3.7–5.3)
Potassium: 5.4 mEq/L — ABNORMAL HIGH (ref 3.7–5.3)
SODIUM: 140 meq/L (ref 137–147)
SODIUM: 130 meq/L — AB (ref 137–147)
SODIUM: 138 meq/L (ref 137–147)
Sodium: 139 mEq/L (ref 137–147)
Sodium: 140 mEq/L (ref 137–147)
Sodium: 136 mEq/L — ABNORMAL LOW (ref 137–147)

## 2014-04-19 LAB — PLATELET COUNT: PLATELETS: 166 10*3/uL (ref 150–400)

## 2014-04-19 LAB — APTT: APTT: 37 s (ref 24–37)

## 2014-04-19 LAB — POCT I-STAT 3, VENOUS BLOOD GAS (G3P V)
ACID-BASE DEFICIT: 3 mmol/L — AB (ref 0.0–2.0)
Bicarbonate: 24.5 mEq/L — ABNORMAL HIGH (ref 20.0–24.0)
O2 SAT: 80 %
PCO2 VEN: 53 mmHg — AB (ref 45.0–50.0)
TCO2: 26 mmol/L (ref 0–100)
pH, Ven: 7.273 (ref 7.250–7.300)
pO2, Ven: 51 mmHg — ABNORMAL HIGH (ref 30.0–45.0)

## 2014-04-19 LAB — POCT I-STAT, CHEM 8
BUN: 14 mg/dL (ref 6–23)
CHLORIDE: 107 meq/L (ref 96–112)
Calcium, Ion: 1.18 mmol/L (ref 1.13–1.30)
Creatinine, Ser: 0.8 mg/dL (ref 0.50–1.35)
GLUCOSE: 153 mg/dL — AB (ref 70–99)
HCT: 26 % — ABNORMAL LOW (ref 39.0–52.0)
Hemoglobin: 8.8 g/dL — ABNORMAL LOW (ref 13.0–17.0)
Potassium: 4.1 mEq/L (ref 3.7–5.3)
Sodium: 136 mEq/L — ABNORMAL LOW (ref 137–147)
TCO2: 23 mmol/L (ref 0–100)

## 2014-04-19 LAB — MAGNESIUM: MAGNESIUM: 3.2 mg/dL — AB (ref 1.5–2.5)

## 2014-04-19 LAB — CREATININE, SERUM
CREATININE: 0.83 mg/dL (ref 0.50–1.35)
GFR calc Af Amer: >90 mL/min (ref >90)
GFR calc non Af Amer: 83 mL/min — ABNORMAL LOW (ref >90)

## 2014-04-19 LAB — HEMOGLOBIN AND HEMATOCRIT, BLOOD
HCT: 23.3 % — ABNORMAL LOW (ref 39.0–52.0)
Hemoglobin: 7.9 g/dL — ABNORMAL LOW (ref 13.0–17.0)

## 2014-04-19 LAB — PROTIME-INR
INR: 1.47 (ref 0.00–1.49)
Prothrombin Time: 17.4 seconds — ABNORMAL HIGH (ref 11.6–15.2)

## 2014-04-19 SURGERY — CORONARY ARTERY BYPASS GRAFTING (CABG)
Anesthesia: General | Site: Chest

## 2014-04-19 MED ORDER — ATORVASTATIN CALCIUM 40 MG PO TABS
40.0000 mg | ORAL_TABLET | Freq: Every day | ORAL | Status: DC
  Administered 2014-04-20 – 2014-04-25 (×6): 40 mg via ORAL
  Filled 2014-04-19 (×7): qty 1

## 2014-04-19 MED ORDER — SODIUM CHLORIDE 0.9 % IV SOLN: 250.0000 mL | INTRAVENOUS | Status: DC

## 2014-04-19 MED ORDER — EPHEDRINE SULFATE 50 MG/ML IJ SOLN
INTRAMUSCULAR | Status: AC
  Filled 2014-04-19: qty 1

## 2014-04-19 MED ORDER — MORPHINE SULFATE 2 MG/ML IJ SOLN
2.0000 mg | INTRAMUSCULAR | Status: DC | PRN
  Administered 2014-04-19 – 2014-04-20 (×2): 2 mg via INTRAVENOUS
  Filled 2014-04-19 (×3): qty 1

## 2014-04-19 MED ORDER — SODIUM CHLORIDE 0.9 % IV SOLN
INTRAVENOUS | Status: DC | PRN
  Administered 2014-04-19: 11:00:00 via INTRAVENOUS

## 2014-04-19 MED ORDER — ASPIRIN 81 MG PO CHEW: 324.0000 mg | CHEWABLE_TABLET | Freq: Every day | ORAL | Status: DC

## 2014-04-19 MED ORDER — FENTANYL CITRATE 0.05 MG/ML IJ SOLN
INTRAMUSCULAR | Status: AC
  Filled 2014-04-19: qty 5

## 2014-04-19 MED ORDER — PHENYLEPHRINE HCL 10 MG/ML IJ SOLN
0.0000 ug/min | INTRAVENOUS | Status: DC
  Filled 2014-04-19: qty 2

## 2014-04-19 MED ORDER — VANCOMYCIN HCL IN DEXTROSE 1-5 GM/200ML-% IV SOLN
1000.0000 mg | Freq: Once | INTRAVENOUS | Status: AC
  Administered 2014-04-19: 1000 mg via INTRAVENOUS
  Filled 2014-04-19: qty 200

## 2014-04-19 MED ORDER — ARTIFICIAL TEARS OP OINT
TOPICAL_OINTMENT | OPHTHALMIC | Status: DC | PRN
  Administered 2014-04-19: 1 via OPHTHALMIC

## 2014-04-19 MED ORDER — DEXMEDETOMIDINE HCL IN NACL 200 MCG/50ML IV SOLN
0.1000 ug/kg/h | INTRAVENOUS | Status: DC
  Administered 2014-04-19: 0.7 ug/kg/h via INTRAVENOUS

## 2014-04-19 MED ORDER — PROPOFOL 10 MG/ML IV BOLUS
INTRAVENOUS | Status: DC | PRN
  Administered 2014-04-19: 60 mg via INTRAVENOUS

## 2014-04-19 MED ORDER — ROCURONIUM BROMIDE 50 MG/5ML IV SOLN
INTRAVENOUS | Status: AC
  Filled 2014-04-19: qty 2

## 2014-04-19 MED ORDER — MOMETASONE FURO-FORMOTEROL FUM 100-5 MCG/ACT IN AERO
2.0000 | INHALATION_SPRAY | Freq: Two times a day (BID) | RESPIRATORY_TRACT | Status: DC
  Administered 2014-04-19 – 2014-04-25 (×10): 2 via RESPIRATORY_TRACT
  Filled 2014-04-19 (×3): qty 8.8

## 2014-04-19 MED ORDER — DOCUSATE SODIUM 100 MG PO CAPS
200.0000 mg | ORAL_CAPSULE | Freq: Every day | ORAL | Status: DC
  Administered 2014-04-20: 200 mg via ORAL
  Filled 2014-04-19: qty 2

## 2014-04-19 MED ORDER — PHENYLEPHRINE 40 MCG/ML (10ML) SYRINGE FOR IV PUSH (FOR BLOOD PRESSURE SUPPORT)
PREFILLED_SYRINGE | INTRAVENOUS | Status: AC
  Filled 2014-04-19: qty 10

## 2014-04-19 MED ORDER — METOPROLOL TARTRATE 1 MG/ML IV SOLN: 2.5000 mg | INTRAVENOUS | Status: DC | PRN

## 2014-04-19 MED ORDER — ACETAMINOPHEN 650 MG RE SUPP
650.0000 mg | Freq: Once | RECTAL | Status: AC
  Administered 2014-04-19: 650 mg via RECTAL

## 2014-04-19 MED ORDER — POTASSIUM CHLORIDE 10 MEQ/50ML IV SOLN: 10.0000 meq | INTRAVENOUS | Status: DC

## 2014-04-19 MED ORDER — MIDAZOLAM HCL 5 MG/5ML IJ SOLN
INTRAMUSCULAR | Status: DC | PRN
  Administered 2014-04-19: 2 mg via INTRAVENOUS
  Administered 2014-04-19: 3 mg via INTRAVENOUS
  Administered 2014-04-19 (×2): 2 mg via INTRAVENOUS
  Administered 2014-04-19: 1 mg via INTRAVENOUS

## 2014-04-19 MED ORDER — MAGNESIUM SULFATE 4000MG/100ML IJ SOLN
4.0000 g | Freq: Once | INTRAMUSCULAR | Status: AC
  Administered 2014-04-19: 4 g via INTRAVENOUS
  Filled 2014-04-19: qty 100

## 2014-04-19 MED ORDER — SUCCINYLCHOLINE CHLORIDE 20 MG/ML IJ SOLN
INTRAMUSCULAR | Status: AC
  Filled 2014-04-19: qty 1

## 2014-04-19 MED ORDER — LIDOCAINE HCL (CARDIAC) 20 MG/ML IV SOLN
INTRAVENOUS | Status: DC | PRN
  Administered 2014-04-19: 60 mg via INTRAVENOUS

## 2014-04-19 MED ORDER — ACETAMINOPHEN 160 MG/5ML PO SOLN: 650.0000 mg | Freq: Once | ORAL | Status: AC

## 2014-04-19 MED ORDER — SODIUM CHLORIDE 0.9 % IJ SOLN
INTRAMUSCULAR | Status: AC
  Filled 2014-04-19: qty 20

## 2014-04-19 MED ORDER — DEXTROSE 5 % IV SOLN
1.5000 g | Freq: Two times a day (BID) | INTRAVENOUS | Status: AC
  Administered 2014-04-19 – 2014-04-21 (×4): 1.5 g via INTRAVENOUS
  Filled 2014-04-19 (×4): qty 1.5

## 2014-04-19 MED ORDER — SODIUM CHLORIDE 0.9 % IJ SOLN
3.0000 mL | Freq: Two times a day (BID) | INTRAMUSCULAR | Status: DC
  Administered 2014-04-20: 3 mL via INTRAVENOUS

## 2014-04-19 MED ORDER — DEXTROSE 50 % IV SOLN
INTRAVENOUS | Status: AC
  Administered 2014-04-19: 16 mL via INTRAVENOUS
  Filled 2014-04-19: qty 50

## 2014-04-19 MED ORDER — LIDOCAINE HCL (CARDIAC) 20 MG/ML IV SOLN
INTRAVENOUS | Status: AC
  Filled 2014-04-19: qty 5

## 2014-04-19 MED ORDER — INSULIN REGULAR BOLUS VIA INFUSION
0.0000 [IU] | Freq: Three times a day (TID) | INTRAVENOUS | Status: DC
  Filled 2014-04-19: qty 10

## 2014-04-19 MED ORDER — INSULIN ASPART 100 UNIT/ML ~~LOC~~ SOLN
0.0000 [IU] | SUBCUTANEOUS | Status: DC
  Administered 2014-04-20 (×4): 2 [IU] via SUBCUTANEOUS

## 2014-04-19 MED ORDER — VECURONIUM BROMIDE 10 MG IV SOLR
INTRAVENOUS | Status: DC | PRN
  Administered 2014-04-19 (×2): 5 mg via INTRAVENOUS

## 2014-04-19 MED ORDER — METOPROLOL TARTRATE 25 MG/10 ML ORAL SUSPENSION
12.5000 mg | Freq: Two times a day (BID) | ORAL | Status: DC
  Filled 2014-04-19 (×3): qty 5

## 2014-04-19 MED ORDER — ALBUMIN HUMAN 5 % IV SOLN
INTRAVENOUS | Status: DC | PRN
  Administered 2014-04-19: 16:00:00
  Administered 2014-04-19: 12:00:00 via INTRAVENOUS

## 2014-04-19 MED ORDER — HEMOSTATIC AGENTS (NO CHARGE) OPTIME
TOPICAL | Status: DC | PRN
  Administered 2014-04-19: 1 via TOPICAL

## 2014-04-19 MED ORDER — ACETAMINOPHEN 500 MG PO TABS
1000.0000 mg | ORAL_TABLET | Freq: Four times a day (QID) | ORAL | Status: DC
  Administered 2014-04-19 – 2014-04-21 (×6): 1000 mg via ORAL
  Filled 2014-04-19 (×10): qty 2

## 2014-04-19 MED ORDER — BISACODYL 10 MG RE SUPP: 10.0000 mg | Freq: Every day | RECTAL | Status: DC

## 2014-04-19 MED ORDER — ONDANSETRON HCL 4 MG/2ML IJ SOLN
4.0000 mg | Freq: Four times a day (QID) | INTRAMUSCULAR | Status: DC | PRN
  Administered 2014-04-21: 4 mg via INTRAVENOUS
  Filled 2014-04-19: qty 2

## 2014-04-19 MED ORDER — SODIUM CHLORIDE 0.45 % IV SOLN
INTRAVENOUS | Status: DC
  Administered 2014-04-19: 13:00:00 via INTRAVENOUS

## 2014-04-19 MED ORDER — NITROGLYCERIN IN D5W 200-5 MCG/ML-% IV SOLN: 0.0000 ug/min | INTRAVENOUS | Status: DC

## 2014-04-19 MED ORDER — PROTAMINE SULFATE 10 MG/ML IV SOLN
INTRAVENOUS | Status: AC
  Filled 2014-04-19: qty 25

## 2014-04-19 MED ORDER — SODIUM CHLORIDE 0.9 % IV SOLN
INTRAVENOUS | Status: DC
  Administered 2014-04-19: 13:00:00 via INTRAVENOUS

## 2014-04-19 MED ORDER — VECURONIUM BROMIDE 10 MG IV SOLR
INTRAVENOUS | Status: AC
  Filled 2014-04-19: qty 10

## 2014-04-19 MED ORDER — STERILE WATER FOR INJECTION IJ SOLN
INTRAMUSCULAR | Status: AC
  Filled 2014-04-19: qty 10

## 2014-04-19 MED ORDER — MIDAZOLAM HCL 2 MG/2ML IJ SOLN: 2.0000 mg | INTRAMUSCULAR | Status: DC | PRN

## 2014-04-19 MED ORDER — THROMBIN 20000 UNITS EX SOLR
OROMUCOSAL | Status: DC | PRN
  Administered 2014-04-19: 07:00:00 via TOPICAL

## 2014-04-19 MED ORDER — ALBUMIN HUMAN 5 % IV SOLN
250.0000 mL | INTRAVENOUS | Status: AC | PRN
  Administered 2014-04-19 – 2014-04-20 (×4): 250 mL via INTRAVENOUS
  Filled 2014-04-19 (×2): qty 250

## 2014-04-19 MED ORDER — SODIUM CHLORIDE 0.9 % IV SOLN
INTRAVENOUS | Status: DC
  Administered 2014-04-19: 1.1 [IU]/h via INTRAVENOUS
  Filled 2014-04-19 (×2): qty 1

## 2014-04-19 MED ORDER — MIDAZOLAM HCL 10 MG/2ML IJ SOLN
INTRAMUSCULAR | Status: AC
  Filled 2014-04-19: qty 2

## 2014-04-19 MED ORDER — LEVALBUTEROL HCL 1.25 MG/0.5ML IN NEBU
1.2500 mg | INHALATION_SOLUTION | Freq: Four times a day (QID) | RESPIRATORY_TRACT | Status: DC
  Administered 2014-04-19 – 2014-04-21 (×4): 1.25 mg via RESPIRATORY_TRACT
  Filled 2014-04-19 (×10): qty 0.5

## 2014-04-19 MED ORDER — BISACODYL 5 MG PO TBEC
10.0000 mg | DELAYED_RELEASE_TABLET | Freq: Every day | ORAL | Status: DC
  Administered 2014-04-20: 10 mg via ORAL
  Filled 2014-04-19: qty 2

## 2014-04-19 MED ORDER — ASPIRIN EC 325 MG PO TBEC
325.0000 mg | DELAYED_RELEASE_TABLET | Freq: Every day | ORAL | Status: DC
  Administered 2014-04-20 – 2014-04-21 (×2): 325 mg via ORAL
  Filled 2014-04-19 (×2): qty 1

## 2014-04-19 MED ORDER — PROTAMINE SULFATE 10 MG/ML IV SOLN
INTRAVENOUS | Status: DC | PRN
  Administered 2014-04-19: 100 mg via INTRAVENOUS
  Administered 2014-04-19: 20 mg via INTRAVENOUS
  Administered 2014-04-19: 100 mg via INTRAVENOUS

## 2014-04-19 MED ORDER — ROCURONIUM BROMIDE 100 MG/10ML IV SOLN
INTRAVENOUS | Status: DC | PRN
  Administered 2014-04-19: 100 mg via INTRAVENOUS

## 2014-04-19 MED ORDER — EPHEDRINE SULFATE 50 MG/ML IJ SOLN
INTRAMUSCULAR | Status: DC | PRN
  Administered 2014-04-19 (×2): 5 mg via INTRAVENOUS

## 2014-04-19 MED ORDER — CALCIUM CHLORIDE 10 % IV SOLN
INTRAVENOUS | Status: DC | PRN
  Administered 2014-04-19 (×5): 100 mg via INTRAVENOUS

## 2014-04-19 MED ORDER — PROPOFOL 10 MG/ML IV BOLUS
INTRAVENOUS | Status: AC
  Filled 2014-04-19: qty 20

## 2014-04-19 MED ORDER — OXYCODONE HCL 5 MG PO TABS
5.0000 mg | ORAL_TABLET | ORAL | Status: DC | PRN
  Administered 2014-04-20 – 2014-04-21 (×3): 5 mg via ORAL
  Filled 2014-04-19 (×3): qty 1

## 2014-04-19 MED ORDER — FAMOTIDINE IN NACL 20-0.9 MG/50ML-% IV SOLN
20.0000 mg | Freq: Two times a day (BID) | INTRAVENOUS | Status: DC
  Administered 2014-04-19: 20 mg via INTRAVENOUS

## 2014-04-19 MED ORDER — ACETAMINOPHEN 160 MG/5ML PO SOLN
1000.0000 mg | Freq: Four times a day (QID) | ORAL | Status: DC
  Filled 2014-04-19: qty 40

## 2014-04-19 MED ORDER — 0.9 % SODIUM CHLORIDE (POUR BTL) OPTIME
TOPICAL | Status: DC | PRN
  Administered 2014-04-19: 1000 mL

## 2014-04-19 MED ORDER — METOPROLOL TARTRATE 12.5 MG HALF TABLET
12.5000 mg | ORAL_TABLET | Freq: Two times a day (BID) | ORAL | Status: DC
  Filled 2014-04-19 (×3): qty 1

## 2014-04-19 MED ORDER — THROMBIN 20000 UNITS EX SOLR
CUTANEOUS | Status: AC
  Filled 2014-04-19: qty 20000

## 2014-04-19 MED ORDER — LACTATED RINGERS IV SOLN: 500.0000 mL | Freq: Once | INTRAVENOUS | Status: AC | PRN

## 2014-04-19 MED ORDER — LACTATED RINGERS IV SOLN
INTRAVENOUS | Status: DC
  Administered 2014-04-19: 13:00:00 via INTRAVENOUS

## 2014-04-19 MED ORDER — PANTOPRAZOLE SODIUM 40 MG PO TBEC
40.0000 mg | DELAYED_RELEASE_TABLET | Freq: Every day | ORAL | Status: DC
  Administered 2014-04-21: 40 mg via ORAL
  Filled 2014-04-19: qty 1

## 2014-04-19 MED ORDER — FENTANYL CITRATE 0.05 MG/ML IJ SOLN
INTRAMUSCULAR | Status: DC | PRN
  Administered 2014-04-19: 100 ug via INTRAVENOUS
  Administered 2014-04-19: 50 ug via INTRAVENOUS
  Administered 2014-04-19: 100 ug via INTRAVENOUS
  Administered 2014-04-19 (×2): 50 ug via INTRAVENOUS
  Administered 2014-04-19: 150 ug via INTRAVENOUS
  Administered 2014-04-19 (×4): 50 ug via INTRAVENOUS
  Administered 2014-04-19: 550 ug via INTRAVENOUS
  Administered 2014-04-19 (×3): 50 ug via INTRAVENOUS
  Administered 2014-04-19: 100 ug via INTRAVENOUS

## 2014-04-19 MED ORDER — PHENYLEPHRINE HCL 10 MG/ML IJ SOLN: 30.0000 ug/min | INTRAVENOUS | Status: DC

## 2014-04-19 MED ORDER — LACTATED RINGERS IV SOLN
INTRAVENOUS | Status: DC | PRN
  Administered 2014-04-19 (×3): via INTRAVENOUS

## 2014-04-19 MED ORDER — DEXTROSE 50 % IV SOLN
16.0000 mL | Freq: Once | INTRAVENOUS | Status: AC
  Administered 2014-04-19: 16 mL via INTRAVENOUS

## 2014-04-19 MED ORDER — SODIUM CHLORIDE 0.9 % IJ SOLN: 3.0000 mL | INTRAMUSCULAR | Status: DC | PRN

## 2014-04-19 MED ORDER — THROMBIN 20000 UNITS EX SOLR
CUTANEOUS | Status: DC | PRN
  Administered 2014-04-19: 20000 [IU] via TOPICAL

## 2014-04-19 MED ORDER — HEPARIN SODIUM (PORCINE) 1000 UNIT/ML IJ SOLN
INTRAMUSCULAR | Status: AC
  Filled 2014-04-19: qty 1

## 2014-04-19 MED ORDER — HEPARIN SODIUM (PORCINE) 1000 UNIT/ML IJ SOLN
INTRAMUSCULAR | Status: DC | PRN
  Administered 2014-04-19: 22000 [IU] via INTRAVENOUS

## 2014-04-19 MED ORDER — MORPHINE SULFATE 2 MG/ML IJ SOLN
1.0000 mg | INTRAMUSCULAR | Status: DC | PRN
  Filled 2014-04-19: qty 1

## 2014-04-19 SURGICAL SUPPLY — 107 items
ATTRACTOMAT 16X20 MAGNETIC DRP (DRAPES) ×4 IMPLANT
BAG DECANTER FOR FLEXI CONT (MISCELLANEOUS) ×4 IMPLANT
BANDAGE ELASTIC 4 VELCRO ST LF (GAUZE/BANDAGES/DRESSINGS) ×4 IMPLANT
BANDAGE ELASTIC 6 VELCRO ST LF (GAUZE/BANDAGES/DRESSINGS) ×4 IMPLANT
BANDAGE GAUZE ELAST BULKY 4 IN (GAUZE/BANDAGES/DRESSINGS) ×4 IMPLANT
BASKET HEART  (ORDER IN 25'S) (MISCELLANEOUS) ×1
BASKET HEART (ORDER IN 25'S) (MISCELLANEOUS) ×1
BASKET HEART (ORDER IN 25S) (MISCELLANEOUS) ×2 IMPLANT
BENZOIN TINCTURE PRP APPL 2/3 (GAUZE/BANDAGES/DRESSINGS) ×4 IMPLANT
BLADE STERNUM SYSTEM 6 (BLADE) ×4 IMPLANT
BLADE SURG 11 STRL SS (BLADE) ×4 IMPLANT
BNDG GAUZE ELAST 4 BULKY (GAUZE/BANDAGES/DRESSINGS) ×4 IMPLANT
CANISTER SUCTION 2500CC (MISCELLANEOUS) ×4 IMPLANT
CARDIAC SUCTION (MISCELLANEOUS) ×4 IMPLANT
CATH ROBINSON RED A/P 18FR (CATHETERS) ×8 IMPLANT
CATH THORACIC 28FR (CATHETERS) ×4 IMPLANT
CATH THORACIC 36FR (CATHETERS) ×4 IMPLANT
CATH THORACIC 36FR RT ANG (CATHETERS) ×4 IMPLANT
CLIP TI MEDIUM 24 (CLIP) IMPLANT
CLIP TI WIDE RED SMALL 24 (CLIP) ×4 IMPLANT
CLOSURE STERI-STRIP 1/2X4 (GAUZE/BANDAGES/DRESSINGS) ×1
CLSR STERI-STRIP ANTIMIC 1/2X4 (GAUZE/BANDAGES/DRESSINGS) ×3 IMPLANT
COVER SURGICAL LIGHT HANDLE (MISCELLANEOUS) ×4 IMPLANT
CRADLE DONUT ADULT HEAD (MISCELLANEOUS) ×4 IMPLANT
DRAPE CARDIOVASCULAR INCISE (DRAPES) ×2
DRAPE SLUSH/WARMER DISC (DRAPES) ×4 IMPLANT
DRAPE SRG 135X102X78XABS (DRAPES) ×2 IMPLANT
DRSG COVADERM 4X14 (GAUZE/BANDAGES/DRESSINGS) ×4 IMPLANT
ELECT CAUTERY BLADE 6.4 (BLADE) ×4 IMPLANT
ELECT REM PT RETURN 9FT ADLT (ELECTROSURGICAL) ×8
ELECTRODE REM PT RTRN 9FT ADLT (ELECTROSURGICAL) ×4 IMPLANT
GLOVE BIO SURGEON STRL SZ 6 (GLOVE) IMPLANT
GLOVE BIO SURGEON STRL SZ 6.5 (GLOVE) IMPLANT
GLOVE BIO SURGEON STRL SZ7 (GLOVE) IMPLANT
GLOVE BIO SURGEON STRL SZ7.5 (GLOVE) IMPLANT
GLOVE BIO SURGEONS STRL SZ 6.5 (GLOVE)
GLOVE BIOGEL PI IND STRL 6 (GLOVE) IMPLANT
GLOVE BIOGEL PI IND STRL 6.5 (GLOVE) IMPLANT
GLOVE BIOGEL PI IND STRL 7.0 (GLOVE) IMPLANT
GLOVE BIOGEL PI INDICATOR 6 (GLOVE)
GLOVE BIOGEL PI INDICATOR 6.5 (GLOVE)
GLOVE BIOGEL PI INDICATOR 7.0 (GLOVE)
GLOVE EUDERMIC 7 POWDERFREE (GLOVE) ×8 IMPLANT
GLOVE ORTHO TXT STRL SZ7.5 (GLOVE) IMPLANT
GOWN STRL REUS W/ TWL LRG LVL3 (GOWN DISPOSABLE) ×8 IMPLANT
GOWN STRL REUS W/ TWL XL LVL3 (GOWN DISPOSABLE) ×2 IMPLANT
GOWN STRL REUS W/TWL LRG LVL3 (GOWN DISPOSABLE) ×8
GOWN STRL REUS W/TWL XL LVL3 (GOWN DISPOSABLE) ×2
HEMOSTAT POWDER SURGIFOAM 1G (HEMOSTASIS) ×12 IMPLANT
HEMOSTAT SURGICEL 2X14 (HEMOSTASIS) ×4 IMPLANT
INSERT FOGARTY 61MM (MISCELLANEOUS) IMPLANT
INSERT FOGARTY XLG (MISCELLANEOUS) IMPLANT
KIT BASIN OR (CUSTOM PROCEDURE TRAY) ×4 IMPLANT
KIT CATH CPB BARTLE (MISCELLANEOUS) ×4 IMPLANT
KIT ROOM TURNOVER OR (KITS) ×4 IMPLANT
KIT SUCTION CATH 14FR (SUCTIONS) ×4 IMPLANT
KIT VASOVIEW ACCESSORY VH 2004 (KITS) ×4 IMPLANT
KIT VASOVIEW W/TROCAR VH 2000 (KITS) ×4 IMPLANT
NS IRRIG 1000ML POUR BTL (IV SOLUTION) ×20 IMPLANT
PACK OPEN HEART (CUSTOM PROCEDURE TRAY) ×4 IMPLANT
PAD ARMBOARD 7.5X6 YLW CONV (MISCELLANEOUS) ×8 IMPLANT
PAD ELECT DEFIB RADIOL ZOLL (MISCELLANEOUS) ×4 IMPLANT
PENCIL BUTTON HOLSTER BLD 10FT (ELECTRODE) ×4 IMPLANT
PUNCH AORTIC ROTATE 4.0MM (MISCELLANEOUS) IMPLANT
PUNCH AORTIC ROTATE 4.5MM 8IN (MISCELLANEOUS) ×4 IMPLANT
PUNCH AORTIC ROTATE 5MM 8IN (MISCELLANEOUS) IMPLANT
SET CARDIOPLEGIA MPS 5001102 (MISCELLANEOUS) ×4 IMPLANT
SPONGE GAUZE 4X4 12PLY (GAUZE/BANDAGES/DRESSINGS) ×8 IMPLANT
SPONGE GAUZE 4X4 12PLY STER LF (GAUZE/BANDAGES/DRESSINGS) ×8 IMPLANT
SPONGE INTESTINAL PEANUT (DISPOSABLE) IMPLANT
SPONGE LAP 18X18 X RAY DECT (DISPOSABLE) ×4 IMPLANT
SPONGE LAP 4X18 X RAY DECT (DISPOSABLE) ×4 IMPLANT
SUT BONE WAX W31G (SUTURE) ×4 IMPLANT
SUT MNCRL AB 4-0 PS2 18 (SUTURE) IMPLANT
SUT PROLENE 3 0 SH DA (SUTURE) IMPLANT
SUT PROLENE 3 0 SH1 36 (SUTURE) ×4 IMPLANT
SUT PROLENE 4 0 RB 1 (SUTURE)
SUT PROLENE 4 0 SH DA (SUTURE) IMPLANT
SUT PROLENE 4-0 RB1 .5 CRCL 36 (SUTURE) IMPLANT
SUT PROLENE 5 0 C 1 36 (SUTURE) IMPLANT
SUT PROLENE 6 0 C 1 30 (SUTURE) IMPLANT
SUT PROLENE 7 0 BV 1 (SUTURE) IMPLANT
SUT PROLENE 7 0 BV1 MDA (SUTURE) ×8 IMPLANT
SUT PROLENE 8 0 BV175 6 (SUTURE) IMPLANT
SUT SILK  1 MH (SUTURE)
SUT SILK 1 MH (SUTURE) IMPLANT
SUT STEEL STERNAL CCS#1 18IN (SUTURE) ×8 IMPLANT
SUT STEEL SZ 6 DBL 3X14 BALL (SUTURE) IMPLANT
SUT VIC AB 1 CTX 36 (SUTURE) ×4
SUT VIC AB 1 CTX36XBRD ANBCTR (SUTURE) ×4 IMPLANT
SUT VIC AB 2-0 CT1 27 (SUTURE) ×2
SUT VIC AB 2-0 CT1 TAPERPNT 27 (SUTURE) ×2 IMPLANT
SUT VIC AB 2-0 CTX 27 (SUTURE) IMPLANT
SUT VIC AB 3-0 SH 27 (SUTURE)
SUT VIC AB 3-0 SH 27X BRD (SUTURE) IMPLANT
SUT VIC AB 3-0 X1 27 (SUTURE) IMPLANT
SUT VICRYL 4-0 PS2 18IN ABS (SUTURE) ×4 IMPLANT
SUTURE E-PAK OPEN HEART (SUTURE) ×4 IMPLANT
SYSTEM SAHARA CHEST DRAIN ATS (WOUND CARE) ×4 IMPLANT
TAPE CLOTH SURG 4X10 WHT LF (GAUZE/BANDAGES/DRESSINGS) ×4 IMPLANT
TAPE PAPER 2X10 WHT MICROPORE (GAUZE/BANDAGES/DRESSINGS) ×4 IMPLANT
TOWEL OR 17X24 6PK STRL BLUE (TOWEL DISPOSABLE) ×4 IMPLANT
TOWEL OR 17X26 10 PK STRL BLUE (TOWEL DISPOSABLE) ×4 IMPLANT
TRAY FOLEY IC TEMP SENS 16FR (CATHETERS) ×4 IMPLANT
TUBING INSUFFLATION 10FT LAP (TUBING) ×4 IMPLANT
UNDERPAD 30X30 INCONTINENT (UNDERPADS AND DIAPERS) ×4 IMPLANT
WATER STERILE IRR 1000ML POUR (IV SOLUTION) ×8 IMPLANT

## 2014-04-19 NOTE — Anesthesia Preprocedure Evaluation (Addendum)
Anesthesia Evaluation  Patient identified by MRN, date of birth, ID band Patient awake    Reviewed: Allergy & Precautions, H&P , NPO status , Patient's Chart, lab work & pertinent test results, reviewed documented beta blocker date and time   Airway Mallampati: II TM Distance: >3 FB     Dental  (+) Poor Dentition, Dental Advisory Given   Pulmonary former smoker,          Cardiovascular hypertension, + CAD     Neuro/Psych    GI/Hepatic GERD-  ,  Endo/Other    Renal/GU      Musculoskeletal   Abdominal   Peds  Hematology   Anesthesia Other Findings   Reproductive/Obstetrics                          Anesthesia Physical Anesthesia Plan  ASA: III  Anesthesia Plan: General   Post-op Pain Management:    Induction: Intravenous  Airway Management Planned: Oral ETT  Additional Equipment: Arterial line, CVP, PA Cath and TEE  Intra-op Plan:   Post-operative Plan: Post-operative intubation/ventilation  Informed Consent: I have reviewed the patients History and Physical, chart, labs and discussed the procedure including the risks, benefits and alternatives for the proposed anesthesia with the patient or authorized representative who has indicated his/her understanding and acceptance.   Dental advisory given  Plan Discussed with: Anesthesiologist and Surgeon  Anesthesia Plan Comments:        Anesthesia Quick Evaluation

## 2014-04-19 NOTE — Op Note (Signed)
CARDIOVASCULAR SURGERY OPERATIVE NOTE  04/19/2014  Surgeon:  [name], MD  First Assistant: Coral Ceo, PA-C   Preoperative Diagnosis:  Severe multi-vessel coronary artery disease   Postoperative Diagnosis:  Same   Procedure:  1. Median Sternotomy 2. Extracorporeal circulation 3.   Coronary artery bypass grafting x 3   Left internal mammary graft to the LAD  SVG to OM  SVG to RCA  4.   Endoscopic vein harvest from both legs   Anesthesia:  General Endotracheal   Clinical History/Surgical Indication:  The patient is a 79 year old [place] gentleman who speaks no English and is here with his friend, whom he lives with and his friend speaks fluent [place] and can translate for him. He has a history of uncontrolled HTN and presented with exertional shortness of breath and mild chest pain. He was found to have an abnormal ECG with a RBBB with nonspecific ST-T changes. He underwent a myocardial perfusion scan on 02/03/2014 showing and EF of 38% with evidence of old scar involving the inferior wall and apex and part of the anterolateral wall. There was hypokinesis of the septum, inferior and lateral walls. There were no symptoms or ECG changes. It was a moderate risk study so he had a cardiac cath 2 days ago showing severe 3-vessel disease with an EF of 25% with a LVEDP of 22. think CABG is the best treatment for him to improve his EF and quality of life and to prevent further ischemia and infarction. I discussed the operative procedure with the patient and family including alternatives, benefits and risks; including but not limited to bleeding, blood transfusion, infection, stroke, myocardial infarction, graft failure, heart block requiring a permanent pacemaker, organ dysfunction, and death. Eddie Dibbles understands and agrees to proceed.     Preparation:  The patient was seen in the  preoperative holding area and the correct patient, correct operation were confirmed with the patient after reviewing the medical record and catheterization. The consent was signed by me. Preoperative antibiotics were given. A pulmonary arterial line and radial arterial line were placed by the anesthesia team. The patient was taken back to the operating room and positioned supine on the operating room table. After being placed under general endotracheal anesthesia by the anesthesia team a foley catheter was placed. The neck, chest, abdomen, and both legs were prepped with betadine soap and solution and draped in the usual sterile manner. A surgical time-out was taken and the correct patient and operative procedure were confirmed with the nursing and anesthesia staff.  TEE: Performed by Dr. Diamantina Monks. This showed a dilated LV with moderate global hypokinesis and EF of 25%. There was mild AI and trivial MR.   Cardiopulmonary Bypass:  A median sternotomy was performed. The pericardium was opened in the midline. Right ventricular function appeared normal. The ascending aorta was of normal size and had no palpable plaque. There were no contraindications to aortic cannulation or cross-clamping. The patient was fully systemically heparinized and the ACT was maintained > 400 sec. The proximal aortic arch was cannulated with a 20 F aortic cannula for arterial inflow. Venous cannulation was performed via the right atrial appendage using a two-staged venous cannula. An antegrade cardioplegia/vent cannula was inserted into the mid-ascending aorta. Aortic occlusion was performed with a single cross-clamp. Systemic cooling to 32 degrees Centigrade and topical cooling of the heart with iced saline were used. Hyperkalemic antegrade cold blood cardioplegia was used to induce diastolic arrest and was then given  at about 20 minute intervals throughout the period of arrest to maintain myocardial temperature at or below 10  degrees centigrade. A temperature probe was inserted into the interventricular septum and an insulating pad was placed in the pericardium.   Left internal mammary harvest:  The left side of the sternum was retracted using the Rultract retractor. The left internal mammary artery was harvested as a pedicle graft. All side branches were clipped. It was a medium-sized vessel of good quality with excellent blood flow. It was ligated distally and divided. It was sprayed with topical papaverine solution to prevent vasospasm.   Endoscopic vein harvest:  The right greater saphenous vein was harvested endoscopically through a 2 cm incision medial to the right knee. It was harvested from the upper thigh to below the knee. It was a medium-sized vein of good quality above the knee but small below the knee. Therefore the left greater saphenous vein was harvested endoscopically through a 2 cm incision medial to the left knee. It was harvested from the thigh. It was a medium-sized vein of good quality. The side branches were all ligated with 4-0 silk ties.    Coronary arteries:  The coronary arteries were examined.   LAD:  Large vessel that was intramyocardial in the proximal 2/3 but the distal 1/3 was on the surface of the heart and had no visible disease.  LCX:  The OM was a medium-sized graftable vessel. There was evidence of prior infarction with thinning of the LV wall in the area supplied by this vessel.  RCA:  Diffusely disease throughout the proximal and mid vessel. Just before the origin of the PDA it was a large vessel with minimal disease.   Grafts:  1. LIMA to the LAD: 2.0 mm. It was sewn end to side using 8-0 prolene continuous suture. 2. SVG to OM:  1.75 mm. It was sewn end to side using 7-0 prolene continuous suture. 3. SVG to RCA:  2.5 mm. It was sewn end to side using 7-0 prolene continuous suture.   The proximal vein graft anastomoses were performed to the mid-ascending aorta using  continuous 6-0 prolene suture. Graft markers were placed around the proximal anastomoses.   Completion:  The patient was rewarmed to 37 degrees Centigrade. The clamp was removed from the LIMA pedicle and there was rapid warming of the septum and return of ventricular fibrillation. The crossclamp was removed with a time of 58 minutes. There was spontaneous return of sinus rhythm. The distal and proximal anastomoses were checked for hemostasis. The position of the grafts was satisfactory. Two temporary epicardial pacing wires were placed on the right atrium and two on the right ventricle. The patient was weaned from CPB without difficulty on no inotropes. CPB time was 74 minutes. Cardiac output was 5 LPM. Heparin was fully reversed with protamine and the aortic and venous cannulas removed. Hemostasis was achieved. Mediastinal and left pleural drainage tubes were placed. The sternum was closed with double #6 stainless steel wires. The fascia was closed with continuous # 1 vicryl suture. The subcutaneous tissue was closed with 2-0 vicryl continuous suture. The skin was closed with 3-0 vicryl subcuticular suture. All sponge, needle, and instrument counts were reported correct at the end of the case. Dry sterile dressings were placed over the incisions and around the chest tubes which were connected to pleurevac suction. The patient was then transported to the surgical intensive care unit in critical but stable condition.  * The patient has large, emphysematous  lungs and the left upper lobe extends across the midline and is lying immediately beneath the sternum. Reentry into the sternum will be risky.

## 2014-04-19 NOTE — Progress Notes (Signed)
CT surgery p.m. Rounds  Recently extubated, slight wheezing we'll start Xopenex Stable hemodynamics Minimal chest tube drainage Postoperative labs reviewed doing well

## 2014-04-19 NOTE — Anesthesia Procedure Notes (Signed)
Anesthesia Procedure Note PA catheter:  Routine monitors. Timeout, sterile prep, drape, FBP R neck.  Trendelenburg position.  1% Lido local, finder and trocar RIJ 1st pass with US guidance.  Cordis placed over J wire. PA catheter in easily.  Sterile dressing applied.  Patient tolerated well, VSS.  Jenita Seashore, MD  06:50-07:08

## 2014-04-19 NOTE — Brief Op Note (Signed)
04/19/2014  10:40 AM  PATIENT:  Peter Kennedy  79 y.o. male  PRE-OPERATIVE DIAGNOSIS:  CAD  POST-OPERATIVE DIAGNOSIS:  CAD  PROCEDURE:   CORONARY ARTERY BYPASS GRAFTING x 3 (LIMA-LAD, SVG-OM, SVG-RCA)  ENDOSCOPIC VEIN HARVEST BILATERAL THIGHS  SURGEON:  [name], MD  ASSISTANT: Coral Ceo, PA-C  ANESTHESIA:   general  PATIENT CONDITION:  ICU - intubated and hemodynamically stable.  PRE-OPERATIVE WEIGHT: 62 kg

## 2014-04-19 NOTE — Interval H&P Note (Signed)
History and Physical Interval Note:  04/19/2014 7:18 AM  Peter Kennedy  has presented today for surgery, with the diagnosis of CAD  The various methods of treatment have been discussed with the patient and family. After consideration of risks, benefits and other options for treatment, the patient has consented to  Procedure(s): CORONARY ARTERY BYPASS GRAFTING (CABG) (N/A) INTRAOPERATIVE TRANSESOPHAGEAL ECHOCARDIOGRAM (N/A) as a surgical intervention .  The patient's history has been reviewed, patient examined, no change in status, stable for surgery.  I have reviewed the patient's chart and labs.  Questions were answered to the patient's satisfaction.     [name]

## 2014-04-19 NOTE — H&P (Signed)
[address]       [place] [address]             [phone]      Cardiothoracic Surgery History and Physical   PCP is No PCP Per Patient  Referring Provider is Donato Schultz, MD   Chief Complaint   Patient presents with   .  Coronary Artery Disease     Surgical eval for possible CABG, Cardiac Cath 04/13/14, 2D ECHO 02/04/14- Dr Anne Fu    HPI:  The patient is a 79 year old [place] gentleman who speaks no English and is here with his friend, whom he lives with and his friend speaks fluent [place] and can translate for him. He has a history of uncontrolled HTN and presented with exertional shortness of breath and mild chest pain. He was found to have an abnormal ECG with a RBBB with nonspecific ST-T changes. He underwent a myocardial perfusion scan on 02/03/2014 showing and EF of 38% with evidence of old scar involving the inferior wall and apex and part of the anterolateral wall. There was hypokinesis of the septum, inferior and lateral walls. There were no symptoms or ECG changes. It was a moderate risk study so he had a cardiac cath 2 days ago showing severe 3-vessel disease with an EF of 25% with a LVEDP of 22.   Past Medical History   Diagnosis  Date   .  Hypertension    .  Coronary artery disease    .  SOB (shortness of breath)    .  Eye inflamed    .  Hx of cardiovascular stress test      Lexiscan Myoview (01/2014): Inf, apical and IL scar; no ischemia; EF 38%; Mod Risk.    History reviewed. No pertinent past surgical history.   History reviewed. No pertinent family history.   Social History  History   Substance Use Topics   .  Smoking status:  Former Games developer   .  Smokeless tobacco:  Not on file   .  Alcohol Use:  No   Has been in the [place] since Nov 2014. His family all moved back to [place] and he lives here with a friend.  Current Outpatient Prescriptions   Medication  Sig  Dispense  Refill   .  acetaminophen (TYLENOL) 325 MG tablet  Take 325-650  mg by mouth every 6 (six) hours as needed for mild pain or headache.     .  albuterol (PROVENTIL HFA;VENTOLIN HFA) 108 (90 BASE) MCG/ACT inhaler  Inhale 2 puffs into the lungs every 6 (six) hours as needed for wheezing or shortness of breath.     Marland Kitchen  atorvastatin (LIPITOR) 40 MG tablet  Take 1 tablet (40 mg total) by mouth daily.  30 tablet  3   .  Fluticasone-Salmeterol (ADVAIR) 250-50 MCG/DOSE AEPB  Inhale 1 puff into the lungs 2 (two) times daily.     Marland Kitchen  losartan-hydrochlorothiazide (HYZAAR) 50-12.5 MG per tablet  Take 1 tablet by mouth daily.     .  metoprolol succinate (TOPROL XL) 25 MG 24 hr tablet  Take 1 tablet (25 mg total) by mouth daily.  30 tablet  3   .  Multiple Vitamins-Minerals (MULTIVITAMIN PO)  Take by mouth daily.     .  nitroGLYCERIN (NITROSTAT) 0.4 MG SL tablet  Place 0.4 mg under the tongue every 5 (five) minutes as needed for chest pain.  No current facility-administered medications for this visit.   No Known Allergies   Review of Systems   Constitutional: Negative for fever, diaphoresis, activity change, appetite change, fatigue and unexpected weight change.  HENT: Negative.  Eyes: Negative.  Respiratory: Positive for shortness of breath.  Cardiovascular: Positive for chest pain. Negative for palpitations and leg swelling.  Gastrointestinal: Negative.  Endocrine: Negative.  Genitourinary: Negative.  Musculoskeletal: Negative.  Skin: Negative.  Allergic/Immunologic: Negative.  Neurological: Negative.  Hematological: Negative.  Psychiatric/Behavioral: Negative.   BP 152/92  Pulse 110  Resp 20  Ht 5\' 5"  (1.651 m)  Wt 137 lb (62.143 kg)  BMI 22.80 kg/m2  SpO2 96%  Physical Exam  Constitutional: He is oriented to person, place, and time. He appears well-developed and well-nourished. No distress.  HENT:  Head: Normocephalic and atraumatic.  Mouth/Throat: Oropharynx is clear and moist.  Poor dentition  Eyes: EOM are normal. Pupils are equal, round, and  reactive to light.  Neck: Normal range of motion. Neck supple. No JVD present. No thyromegaly present.  Cardiovascular: Normal rate, regular rhythm and normal heart sounds.  No murmur heard.  Pulmonary/Chest: Effort normal and breath sounds normal. No respiratory distress. He has no rales.  Abdominal: Soft. Bowel sounds are normal. He exhibits no distension and no mass. There is no tenderness.  Musculoskeletal: Normal range of motion. He exhibits no edema and no tenderness.  Neurological: He is alert and oriented to person, place, and time. He has normal strength. No cranial nerve deficit or sensory deficit.  Skin: Skin is warm and dry.  Psychiatric: He has a normal mood and affect.  Diagnostic Tests:  *Cardiovascular Imaging at [facility] [address] [place], [place] [address] [phone]  ------------------------------------------------------------ Echocardiography  Patient: Deanthony, Maull MR #: [id number] Study Date: 02/04/2014 Gender: M Age: 30 Height: 165.1cm Weight: 63.5kg BSA: 1.85m^2 Pt. Status: Room:  ATTENDING Cira Rue, Mark REFERRING Donato Schultz SONOGRAPHER Clearence Ped, RCS PERFORMING [facility], Outpatient cc:  ------------------------------------------------------------ LV EF: 25% - 30%  ------------------------------------------------------------ Indications: 786.05 Dyspnea.  ------------------------------------------------------------ History: PMH: Chest pain. Risk factors: Hypertension.  ------------------------------------------------------------ Study Conclusions  - Left ventricle: The cavity size was mildly dilated. Wall thickness was normal. Systolic function was severely reduced. The estimated ejection fraction was in the range of 25% to 30%. There was fusion of early and atrial contributions to ventricular filling. - Aortic valve: Mild regurgitation. - Mitral valve: Mild to moderate regurgitation  directed eccentrically.  ------------------------------------------------------------ Labs, prior tests, procedures, and surgery: ECG. Abnormal. Echocardiography. M-mode, complete 2D, spectral Doppler, and color Doppler. Height: Height: 165.1cm. Height: 65in. Weight: Weight: 63.5kg. Weight: 139.7lb. Body mass index: BMI: 23.3kg/m^2. Body surface area: BSA: 1.27m^2. Blood pressure: 130/82. Patient status: Outpatient. Location: Echo laboratory.  ------------------------------------------------------------  ------------------------------------------------------------ Left ventricle: The cavity size was mildly dilated. Wall thickness was normal. Systolic function was severely reduced. The estimated ejection fraction was in the range of 25% to 30%. There was fusion of early and atrial contributions to ventricular filling.  ------------------------------------------------------------ Aortic valve: Doppler: Mild regurgitation. VTI ratio of LVOT to aortic valve: 0.7. Peak velocity ratio of LVOT to aortic valve: 0.75. Mean gradient: 4mm Hg (S).  ------------------------------------------------------------ Aorta: Ascending aorta: The ascending aorta was mildly dilated.  ------------------------------------------------------------ Mitral valve: Structurally normal valve. Doppler: Mild to moderate regurgitation directed eccentrically. Valve area by pressure half-time: 5.24cm^2. Indexed valve area by pressure half-time: 3.08cm^2/m^2. Mean gradient: 4mm Hg (D). Peak gradient: 10mm Hg (D).  ------------------------------------------------------------ Left atrium: LA volume/ BSA=21.2 ml/m2 The atrium  was normal in size.  ------------------------------------------------------------ Right ventricle: The cavity size was normal. Systolic function was normal.  ------------------------------------------------------------ Ventricular septum: There is septal bounce due to the  BBB  ------------------------------------------------------------ Tricuspid valve: Doppler: Trivial regurgitation.  ------------------------------------------------------------ Pulmonary artery: Systolic pressure was within the normal range.  ------------------------------------------------------------ Right atrium: The atrium was normal in size.  ------------------------------------------------------------ Pericardium: There was no pericardial effusion.  ------------------------------------------------------------ Systemic veins: Inferior vena cava: The vessel was normal in size; the respirophasic diameter changes were in the normal range (= 50%); findings are consistent with normal central venous pressure. Diameter: 15mm.  ------------------------------------------------------------ Pre bypass:  Post bypass:  ------------------------------------------------------------  2D measurements Normal Doppler measurements Normal IVC Main pulmonary Diam 15 mm ------ artery Left ventricle Pressure, 30 mm Hg =30 LVID ED, 53.8 mm 43-52 S chord, Left ventricle PLAX Ea, lat 11 cm/s ------ LVID ES, 48.7 mm 23-38 ann, tiss chord, DP PLAX E/Ea, lat 10.2 ------ FS, chord, 9 % >29 ann, tiss 7 PLAX DP LVPW, ED 8.21 mm ------ Ea, med 12.4 cm/s ------ IVS/LVPW 1.13 <1.3 ann, tiss ratio, ED DP Vol ED, 100 ml ------ E/Ea, med 9.11 ------ MOD1 ann, tiss Vol ES, 58 ml ------ DP MOD1 LVOT EF, MOD1 42 % ------ Peak vel, 97.8 cm/s ------ Vol index, 59 ml/m^2 ------ S ED, MOD1 VTI, S 18.6 cm ------ Vol index, 34 ml/m^2 ------ Aortic valve ES, MOD1 Peak vel, 130 cm/s ------ Vol ED, 103 ml ------ S MOD2 Mean vel, 97.8 cm/s ------ Vol ES, 63 ml ------ S MOD2 VTI, S 26.5 cm ------ EF, MOD2 39 % ------ Mean 4 mm Hg ------ Stroke 40 ml ------ gradient, vol, MOD2 S Vol index, 61 ml/m^2 ------ VTI ratio 0.7 ------ ED, MOD2 LVOT/AV Vol index, 37 ml/m^2 ------ Peak vel 0.75 ------ ES, MOD2  ratio, Stroke 23.5 ml/m^2 ------ LVOT/AV index, Regurg PHT 320 ms ------ MOD2 Mitral valve Ventricular septum Peak E vel 113 cm/s ------ IVS, ED 9.28 mm ------ Mean vel, 83.3 cm/s ------ Aorta D Root diam, 35 mm ------ Decelerati 102 ms 150-23 ED on time 0 Left atrium Pressure 42 ms ------ AP dim 40 mm ------ half-time AP dim 2.35 cm/m^2 <2.2 Mean 4 mm Hg ------ index gradient, Right ventricle D RVID ED, 14.7 mm 19-38 Peak 10 mm Hg ------ PLAX gradient, D Area (PHT) 5.24 cm^2 ------ Area index 3.08 cm^2/m ------ (PHT) ^2 Annulus 22.2 cm ------ VTI Tricuspid valve Regurg 259 cm/s ------ peak vel Peak RV-RA 27 mm Hg ------ gradient, S Systemic veins Estimated 3 mm Hg ------ CVP Right ventricle Pressure, 30 mm Hg <30 S Sa vel, 18.5 cm/s ------ lat ann, tiss DP  ------------------------------------------------------------ Prepared and Electronically Authenticated by  Kristeen MissNahser, Philip 2015-03-05T11:45:[id number]  CARDIAC CATHETERIZATION  PROCEDURE: Left heart catheterization with selective coronary angiography, left ventriculogram via the radial artery approach.  INDICATIONS: Newly discovered cardiomyopathy 25%. Dyspnea. No chest pain.  The risks, benefits, and details of the procedure were explained to the patient, including possibilities of stroke, heart attack, death, renal impairment, arterial damage, bleeding. The patient verbalized understanding and wanted to proceed. Informed written consent was obtained.  PROCEDURE TECHNIQUE: Allen's test was performed pre-and post procedure and was normal. The right radial artery site was prepped and draped in a sterile fashion. One percent lidocaine was used for local anesthesia. Using the modified Seldinger technique a 5 French hydrophilic sheath was inserted into the radial artery without difficulty. 3 mg of verapamil was administered via the sheath. A Judkins right #  4 catheter with the guidance of a Versicore wire was placed in the  right coronary cusp and selectively cannulated the right coronary artery. After traversing the aortic arch, 3000 units of heparin IV was administered. A Judkins left #3.5 catheter was used to selectively cannulate the left main artery. Multiple views with hand injection of Omnipaque were obtained. Catheter a pigtail catheter was used to cross into the left ventricle, hemodynamics were obtained, and a left ventriculogram was performed in the RAO position with power injection. Following the procedure, sheath was removed, patient was hemodynamically stable, hemostasis was maintained with a Terumo T band.  CONTRAST: Total of 80 ml.  FLOUROSCOPY TIME: 2.8 min.  COMPLICATIONS: None.  HEMODYNAMICS: Aortic pressure was 182/71mmHg; LV systolic pressure was ; LVEDP 22 mmHg. There was no gradient between the left ventricle and aorta.  ANGIOGRAPHIC DATA:  Left main: Branches into LAD and circumflex. No significant disease.  Left anterior descending (LAD): Proximal/mid LAD stenosis of 80%, hazy calcified plaque. Mid sized mid diagonal branch with minor luminal irregularities.  Circumflex artery (CIRC): High OM/ ramus branch, minor luminal irregularities. 30-40% proximal disease. Mid OM branch 100% proximal/mid occlusion with reconsitution, large vessel.  Right coronary artery (RCA): Diffuse disease proximal/ mid 90%, 80% mid, 95% mid/distal. Large caliber PDA. Dominant vessel.  LEFT VENTRICULOGRAM: Left ventricular angiogram was done in the 30 RAO projection and revealed decreased left ventricular systolic function severe with an estimated ejection fraction of 25%. There is apical, mid to distal antero/inferior hypokinesis.  IMPRESSIONS:  1. Severe 3 vessel CAD 2. Severely reduced left ventricular systolic function. LVEDP 22 mmHg. Ejection fraction 25%. RECOMMENDATION: CABG referral. Since pain free, will see as outpatient. Call has been placed to [facility].    Impression:   He has severe 3-vessel CAD with  marked LV dysfunction. His myocardial perfusion scan showed a large fixed defect but no ischemia. I doubt this is accurate given the degree of stenosis and the territories involved. I think CABG is the best treatment for him to improve his EF and quality of life and to prevent further ischemia and infarction. I discussed the operative procedure with the patient and family including alternatives, benefits and risks; including but not limited to bleeding, blood transfusion, infection, stroke, myocardial infarction, graft failure, heart block requiring a permanent pacemaker, organ dysfunction, and death. Eddie Dibbles understands and agrees to proceed.

## 2014-04-19 NOTE — Anesthesia Postprocedure Evaluation (Signed)
  Anesthesia Post-op Note  Patient: Peter Kennedy  Procedure(s) Performed: Procedure(s): CORONARY ARTERY BYPASS GRAFTING (CABG)  (N/A) INTRAOPERATIVE TRANSESOPHAGEAL ECHOCARDIOGRAM (N/A)  Patient Location: SICU  Anesthesia Type:General  Level of Consciousness: Patient remains intubated per anesthesia plan  Airway and Oxygen Therapy: Patient remains intubated per anesthesia plan and Patient placed on Ventilator (see vital sign flow sheet for setting)  Post-op Pain: none  Post-op Assessment: Post-op Vital signs reviewed, Patient's Cardiovascular Status Stable, Respiratory Function Stable, Patent Airway, No signs of Nausea or vomiting and Pain level controlled  Post-op Vital Signs: stable  Last Vitals:  Filed Vitals:   04/19/14 1221  BP: 117/66  Pulse: 91  Temp:   Resp: 12    Complications: No apparent anesthesia complications

## 2014-04-19 NOTE — Procedures (Signed)
Extubation Procedure Note  Patient Details:   Name: Hiyan Fifield DOB: 11-30-1935 MRN: [id number]   Airway Documentation:     Evaluation  O2 sats: stable throughout Complications: No apparent complications Patient did tolerate procedure well. Bilateral Breath Sounds: Clear   Yes NIF -32, VC 620, Pt able to vocalize, Pt placed on 4lpm [place]. Sp02 100%  Melanee Spry 04/19/2014, 6:04 PM

## 2014-04-19 NOTE — OR Nursing (Signed)
Dr Laneta Simmers started at [phone]

## 2014-04-19 NOTE — Transfer of Care (Signed)
Immediate Anesthesia Transfer of Care Note  Patient: Peter Kennedy  Procedure(s) Performed: Procedure(s): CORONARY ARTERY BYPASS GRAFTING (CABG)  (N/A) INTRAOPERATIVE TRANSESOPHAGEAL ECHOCARDIOGRAM (N/A)  Patient Location: SICU  Anesthesia Type:General  Level of Consciousness: sedated, unresponsive and Patient remains intubated per anesthesia plan  Airway & Oxygen Therapy: Patient remains intubated per anesthesia plan and Patient placed on Ventilator (see vital sign flow sheet for setting)  Post-op Assessment: Report given to PACU RN and Post -op Vital signs reviewed and stable  Post vital signs: Reviewed and stable  Complications: No apparent anesthesia complications

## 2014-04-20 ENCOUNTER — Encounter (HOSPITAL_COMMUNITY): Payer: Self-pay | Admitting: Surgery

## 2014-04-20 ENCOUNTER — Inpatient Hospital Stay (HOSPITAL_COMMUNITY): Payer: No Typology Code available for payment source

## 2014-04-20 LAB — CBC
HEMATOCRIT: 24.6 % — AB (ref 39.0–52.0)
HEMATOCRIT: 25.2 % — AB (ref 39.0–52.0)
HEMOGLOBIN: 8.3 g/dL — AB (ref 13.0–17.0)
Hemoglobin: 8.2 g/dL — ABNORMAL LOW (ref 13.0–17.0)
MCH: 29.6 pg (ref 26.0–34.0)
MCH: 29.2 pg (ref 26.0–34.0)
MCHC: 33.3 g/dL (ref 30.0–36.0)
MCHC: 32.9 g/dL (ref 30.0–36.0)
MCV: 88.8 fL (ref 78.0–100.0)
MCV: 88.7 fL (ref 78.0–100.0)
Platelets: 143 10*3/uL — ABNORMAL LOW (ref 150–400)
Platelets: 141 10*3/uL — ABNORMAL LOW (ref 150–400)
RBC: 2.77 MIL/uL — ABNORMAL LOW (ref 4.22–5.81)
RBC: 2.84 MIL/uL — ABNORMAL LOW (ref 4.22–5.81)
RDW: 13.7 % (ref 11.5–15.5)
RDW: 14 % (ref 11.5–15.5)
WBC: 16.6 10*3/uL — AB (ref 4.0–10.5)
WBC: 16.3 10*3/uL — ABNORMAL HIGH (ref 4.0–10.5)

## 2014-04-20 LAB — BASIC METABOLIC PANEL
BUN: 15 mg/dL (ref 6–23)
CALCIUM: 7.9 mg/dL — AB (ref 8.4–10.5)
CHLORIDE: 104 meq/L (ref 96–112)
CO2: 23 meq/L (ref 19–32)
CREATININE: 0.91 mg/dL (ref 0.50–1.35)
GFR calc non Af Amer: 80 mL/min — ABNORMAL LOW (ref >90)
Glucose, Bld: 123 mg/dL — ABNORMAL HIGH (ref 70–99)
Potassium: 4.3 mEq/L (ref 3.7–5.3)
Sodium: 138 mEq/L (ref 137–147)

## 2014-04-20 LAB — CREATININE, SERUM
CREATININE: 0.88 mg/dL (ref 0.50–1.35)
GFR calc Af Amer: >90 mL/min (ref >90)
GFR calc non Af Amer: 81 mL/min — ABNORMAL LOW (ref >90)

## 2014-04-20 LAB — HIV RAPID SCREEN (BLD OR BODY FLD EXPOSURE): Rapid HIV Screen: NONREACTIVE

## 2014-04-20 LAB — GLUCOSE, CAPILLARY
GLUCOSE-CAPILLARY: 127 mg/dL — AB (ref 70–99)
Glucose-Capillary: 117 mg/dL — ABNORMAL HIGH (ref 70–99)
Glucose-Capillary: 130 mg/dL — ABNORMAL HIGH (ref 70–99)
Glucose-Capillary: 78 mg/dL (ref 70–99)
Glucose-Capillary: 142 mg/dL — ABNORMAL HIGH (ref 70–99)

## 2014-04-20 LAB — MAGNESIUM
Magnesium: 2.2 mg/dL (ref 1.5–2.5)
Magnesium: 2.1 mg/dL (ref 1.5–2.5)

## 2014-04-20 MED ORDER — ENOXAPARIN SODIUM 40 MG/0.4ML ~~LOC~~ SOLN
40.0000 mg | Freq: Every day | SUBCUTANEOUS | Status: DC
  Administered 2014-04-20: 40 mg via SUBCUTANEOUS
  Filled 2014-04-20 (×2): qty 0.4

## 2014-04-20 MED ORDER — PNEUMOCOCCAL VAC POLYVALENT 25 MCG/0.5ML IJ INJ: 0.5000 mL | INJECTION | INTRAMUSCULAR | Status: DC | PRN

## 2014-04-20 MED ORDER — METOPROLOL TARTRATE 12.5 MG HALF TABLET
12.5000 mg | ORAL_TABLET | Freq: Once | ORAL | Status: AC
  Administered 2014-04-20: 12.5 mg via ORAL
  Filled 2014-04-20: qty 1

## 2014-04-20 MED ORDER — INSULIN ASPART 100 UNIT/ML ~~LOC~~ SOLN: 0.0000 [IU] | SUBCUTANEOUS | Status: DC

## 2014-04-20 MED FILL — Potassium Chloride Inj 2 mEq/ML: INTRAVENOUS | Qty: 40 | Status: AC

## 2014-04-20 MED FILL — Heparin Sodium (Porcine) Inj 1000 Unit/ML: INTRAMUSCULAR | Qty: 10 | Status: AC

## 2014-04-20 MED FILL — Heparin Sodium (Porcine) Inj 1000 Unit/ML: INTRAMUSCULAR | Qty: 30 | Status: AC

## 2014-04-20 MED FILL — Magnesium Sulfate Inj 50%: INTRAMUSCULAR | Qty: 10 | Status: AC

## 2014-04-20 MED FILL — Electrolyte-R (PH 7.4) Solution: INTRAVENOUS | Qty: 4000 | Status: AC

## 2014-04-20 MED FILL — Sodium Bicarbonate IV Soln 8.4%: INTRAVENOUS | Qty: 50 | Status: AC

## 2014-04-20 MED FILL — Sodium Chloride IV Soln 0.9%: INTRAVENOUS | Qty: 2000 | Status: AC

## 2014-04-20 MED FILL — Lidocaine HCl IV Inj 20 MG/ML: INTRAVENOUS | Qty: 5 | Status: AC

## 2014-04-20 MED FILL — Mannitol IV Soln 20%: INTRAVENOUS | Qty: 500 | Status: AC

## 2014-04-20 NOTE — Progress Notes (Signed)
[facility] BRIEF SICU PROGRESS NOTE  1 Day Post-Op  S/P Procedure(s) (LRB): CORONARY ARTERY BYPASS GRAFTING (CABG)  (N/A) INTRAOPERATIVE TRANSESOPHAGEAL ECHOCARDIOGRAM (N/A)   Stable day NSR w/ stable BP now off Neo drip O2 sats 98% on 2 L/min UOP adequate  Plan: Continue current plan  Purcell Nails 04/20/2014 4:33 PM

## 2014-04-20 NOTE — Progress Notes (Signed)
1 Day Post-Op Procedure(s) (LRB): CORONARY ARTERY BYPASS GRAFTING (CABG)  (N/A) INTRAOPERATIVE TRANSESOPHAGEAL ECHOCARDIOGRAM (N/A) Subjective:  No complaints. Looks comfortable  Objective: Vital signs in last 24 hours: Temp:  [96.4 F (35.8 C)-99.9 F (37.7 C)] 99 F (37.2 C) (05/19 0700) Pulse Rate:  [81-96] 88 (05/19 0700) Cardiac Rhythm:  [-] Atrial paced (05/19 0600) Resp:  [0-16] 16 (05/18 1759) BP: (96-121)/(42-69) 108/42 mmHg (05/19 0700) SpO2:  [96 %-100 %] 96 % (05/19 0700) Arterial Line BP: (76-149)/(41-79) 130/45 mmHg (05/19 0700) FiO2 (%):  [40 %-50 %] 40 % (05/18 1700) Weight:  [61.68 kg (135 lb 15.7 oz)-66.5 kg (146 lb 9.7 oz)] 66.5 kg (146 lb 9.7 oz) (05/19 0500)  Hemodynamic parameters for last 24 hours: PAP: (29-54)/(14-27) 35/14 mmHg CO:  [3.6 L/min-4.9 L/min] 4.9 L/min CI:  [2.1 L/min/m2-2.9 L/min/m2] 2.9 L/min/m2  Intake/Output from previous day: 05/18 0701 - 05/19 0700 In: 4579.2 [I.V.:2949.2; Blood:200; NG/GT:30; IV Piggyback:1400] Out: 4355 [Urine:3415; Emesis/NG output:30; Blood:400; Chest Tube:510] Intake/Output this shift:    General appearance: alert Neurologic: intact Heart: regular rate and rhythm, S1, S2 normal, no murmur, click, rub or gallop Lungs: clear to auscultation bilaterally Abdomen: soft, non-tender; bowel sounds normal; no masses,  no organomegaly Extremities: extremities normal, atraumatic, no cyanosis or edema Wound: dressing dry  Lab Results:  Recent Labs  04/19/14 1700 04/19/14 1719 04/20/14 0345  WBC 19.0*  --  16.6*  HGB 9.4* 8.8* 8.2*  HCT 28.0* 26.0* 24.6*  PLT 139*  --  143*   BMET:  Recent Labs  04/19/14 1719 04/20/14 0345  NA 136* 138  K 4.1 4.3  CL 107 104  CO2  --  23  GLUCOSE 153* 123*  BUN 14 15  CREATININE 0.80 0.91  CALCIUM  --  7.9*    PT/INR:  Recent Labs  04/19/14 1215  LABPROT 17.4*  INR 1.47   ABG    Component Value Date/Time   PHART 7.369 04/19/2014 2003   HCO3 25.3*  04/19/2014 2003   TCO2 27 04/19/2014 2003   ACIDBASEDEF 1.0 04/19/2014 1744   O2SAT 98.0 04/19/2014 2003   CBG (last 3)   Recent Labs  04/19/14 2245 04/19/14 2347 04/20/14 0342  GLUCAP 90 100* 117*    Assessment/Plan: S/P Procedure(s) (LRB): CORONARY ARTERY BYPASS GRAFTING (CABG)  (N/A) INTRAOPERATIVE TRANSESOPHAGEAL ECHOCARDIOGRAM (N/A) Hemodynamically stable following CABG. Still requiring low dose neo. Wean as tolerated. Hold beta blocker until off neo. Mobilize Diuresis once off neo Diabetes control: Hgb A1c was 6.4 preop on no meds. No prior diagnosis of DM. Continue SSI. d/c tubes/lines Continue foley due to patient in ICU and urinary output monitoring See progression orders   LOS: 1 day    [name] 04/20/2014

## 2014-04-20 NOTE — Progress Notes (Signed)
Patient was source to potential blood and body fluid exposure. Lab work in process.

## 2014-04-21 ENCOUNTER — Inpatient Hospital Stay (HOSPITAL_COMMUNITY): Payer: No Typology Code available for payment source

## 2014-04-21 LAB — BASIC METABOLIC PANEL
BUN: 18 mg/dL (ref 6–23)
CHLORIDE: 105 meq/L (ref 96–112)
CO2: 25 mEq/L (ref 19–32)
Calcium: 8 mg/dL — ABNORMAL LOW (ref 8.4–10.5)
Creatinine, Ser: 0.88 mg/dL (ref 0.50–1.35)
GFR calc non Af Amer: 81 mL/min — ABNORMAL LOW (ref >90)
Glucose, Bld: 70 mg/dL (ref 70–99)
POTASSIUM: 3.9 meq/L (ref 3.7–5.3)
Sodium: 140 mEq/L (ref 137–147)

## 2014-04-21 LAB — CBC
HCT: 22.7 % — ABNORMAL LOW (ref 39.0–52.0)
HEMOGLOBIN: 7.4 g/dL — AB (ref 13.0–17.0)
MCH: 29.5 pg (ref 26.0–34.0)
MCHC: 32.6 g/dL (ref 30.0–36.0)
MCV: 90.4 fL (ref 78.0–100.0)
Platelets: 123 10*3/uL — ABNORMAL LOW (ref 150–400)
RBC: 2.51 MIL/uL — AB (ref 4.22–5.81)
RDW: 14.2 % (ref 11.5–15.5)
WBC: 12.9 10*3/uL — ABNORMAL HIGH (ref 4.0–10.5)

## 2014-04-21 LAB — HEPATITIS C ANTIBODY (REFLEX): HCV Ab: NEGATIVE

## 2014-04-21 LAB — GLUCOSE, CAPILLARY
GLUCOSE-CAPILLARY: 128 mg/dL — AB (ref 70–99)
Glucose-Capillary: 71 mg/dL (ref 70–99)
Glucose-Capillary: 114 mg/dL — ABNORMAL HIGH (ref 70–99)
Glucose-Capillary: 131 mg/dL — ABNORMAL HIGH (ref 70–99)
Glucose-Capillary: 125 mg/dL — ABNORMAL HIGH (ref 70–99)
Glucose-Capillary: 112 mg/dL — ABNORMAL HIGH (ref 70–99)

## 2014-04-21 LAB — HEPATITIS B SURFACE ANTIGEN: Hepatitis B Surface Ag: NEGATIVE

## 2014-04-21 MED ORDER — SODIUM CHLORIDE 0.9 % IJ SOLN
3.0000 mL | Freq: Two times a day (BID) | INTRAMUSCULAR | Status: DC
  Administered 2014-04-21 – 2014-04-24 (×7): 3 mL via INTRAVENOUS

## 2014-04-21 MED ORDER — METOPROLOL TARTRATE 12.5 MG HALF TABLET
12.5000 mg | ORAL_TABLET | Freq: Two times a day (BID) | ORAL | Status: DC
  Filled 2014-04-21 (×2): qty 1

## 2014-04-21 MED ORDER — BISACODYL 5 MG PO TBEC: 10.0000 mg | DELAYED_RELEASE_TABLET | Freq: Every day | ORAL | Status: DC | PRN

## 2014-04-21 MED ORDER — ACETAMINOPHEN 325 MG PO TABS: 650.0000 mg | ORAL_TABLET | Freq: Four times a day (QID) | ORAL | Status: DC | PRN

## 2014-04-21 MED ORDER — PANTOPRAZOLE SODIUM 40 MG PO TBEC
40.0000 mg | DELAYED_RELEASE_TABLET | Freq: Every day | ORAL | Status: DC
  Administered 2014-04-22 – 2014-04-25 (×4): 40 mg via ORAL
  Filled 2014-04-21 (×4): qty 1

## 2014-04-21 MED ORDER — SODIUM CHLORIDE 0.9 % IJ SOLN: 3.0000 mL | INTRAMUSCULAR | Status: DC | PRN

## 2014-04-21 MED ORDER — FUROSEMIDE 40 MG PO TABS
40.0000 mg | ORAL_TABLET | Freq: Every day | ORAL | Status: DC
  Administered 2014-04-22 – 2014-04-23 (×2): 40 mg via ORAL
  Filled 2014-04-21 (×3): qty 1

## 2014-04-21 MED ORDER — FUROSEMIDE 10 MG/ML IJ SOLN
40.0000 mg | Freq: Once | INTRAMUSCULAR | Status: AC
  Administered 2014-04-21: 40 mg via INTRAVENOUS
  Filled 2014-04-21: qty 4

## 2014-04-21 MED ORDER — METOPROLOL TARTRATE 12.5 MG HALF TABLET
12.5000 mg | ORAL_TABLET | Freq: Two times a day (BID) | ORAL | Status: DC
  Administered 2014-04-21 (×2): 12.5 mg via ORAL
  Filled 2014-04-21 (×4): qty 1

## 2014-04-21 MED ORDER — SODIUM CHLORIDE 0.9 % IV SOLN: 250.0000 mL | INTRAVENOUS | Status: DC | PRN

## 2014-04-21 MED ORDER — POTASSIUM CHLORIDE CRYS ER 20 MEQ PO TBCR
40.0000 meq | EXTENDED_RELEASE_TABLET | Freq: Once | ORAL | Status: AC
  Administered 2014-04-21: 40 meq via ORAL
  Filled 2014-04-21: qty 2

## 2014-04-21 MED ORDER — ONDANSETRON HCL 4 MG/2ML IJ SOLN: 4.0000 mg | Freq: Four times a day (QID) | INTRAMUSCULAR | Status: DC | PRN

## 2014-04-21 MED ORDER — POTASSIUM CHLORIDE CRYS ER 20 MEQ PO TBCR
40.0000 meq | EXTENDED_RELEASE_TABLET | Freq: Every day | ORAL | Status: DC
  Administered 2014-04-22 – 2014-04-23 (×2): 40 meq via ORAL
  Filled 2014-04-21 (×3): qty 2

## 2014-04-21 MED ORDER — BISACODYL 10 MG RE SUPP: 10.0000 mg | Freq: Every day | RECTAL | Status: DC | PRN

## 2014-04-21 MED ORDER — TRAMADOL HCL 50 MG PO TABS: 50.0000 mg | ORAL_TABLET | ORAL | Status: DC | PRN

## 2014-04-21 MED ORDER — DOCUSATE SODIUM 100 MG PO CAPS
200.0000 mg | ORAL_CAPSULE | Freq: Every day | ORAL | Status: DC
  Administered 2014-04-22 – 2014-04-25 (×4): 200 mg via ORAL
  Filled 2014-04-21 (×4): qty 2

## 2014-04-21 MED ORDER — OXYCODONE HCL 5 MG PO TABS
5.0000 mg | ORAL_TABLET | ORAL | Status: DC | PRN
  Administered 2014-04-22 – 2014-04-23 (×5): 5 mg via ORAL
  Administered 2014-04-23: 10 mg via ORAL
  Administered 2014-04-24: 5 mg via ORAL
  Filled 2014-04-21 (×2): qty 1
  Filled 2014-04-21: qty 2
  Filled 2014-04-21 (×2): qty 1
  Filled 2014-04-21: qty 2
  Filled 2014-04-21 (×2): qty 1

## 2014-04-21 MED ORDER — INSULIN ASPART 100 UNIT/ML ~~LOC~~ SOLN
0.0000 [IU] | Freq: Three times a day (TID) | SUBCUTANEOUS | Status: DC
  Administered 2014-04-23: 2 [IU] via SUBCUTANEOUS
  Administered 2014-04-24: 4 [IU] via SUBCUTANEOUS

## 2014-04-21 MED ORDER — ASPIRIN EC 325 MG PO TBEC
325.0000 mg | DELAYED_RELEASE_TABLET | Freq: Every day | ORAL | Status: DC
  Administered 2014-04-22 – 2014-04-25 (×4): 325 mg via ORAL
  Filled 2014-04-21 (×5): qty 1

## 2014-04-21 MED ORDER — LEVALBUTEROL HCL 1.25 MG/0.5ML IN NEBU
1.2500 mg | INHALATION_SOLUTION | Freq: Two times a day (BID) | RESPIRATORY_TRACT | Status: DC
  Administered 2014-04-21: 1.25 mg via RESPIRATORY_TRACT
  Filled 2014-04-21 (×4): qty 0.5

## 2014-04-21 MED ORDER — ALBUTEROL SULFATE (2.5 MG/3ML) 0.083% IN NEBU: 3.0000 mL | INHALATION_SOLUTION | Freq: Four times a day (QID) | RESPIRATORY_TRACT | Status: DC | PRN

## 2014-04-21 MED ORDER — MOVING RIGHT ALONG BOOK
Freq: Once | Status: DC
  Filled 2014-04-21: qty 1

## 2014-04-21 MED ORDER — ONDANSETRON HCL 4 MG PO TABS: 4.0000 mg | ORAL_TABLET | Freq: Four times a day (QID) | ORAL | Status: DC | PRN

## 2014-04-21 NOTE — Progress Notes (Signed)
Pt came to unit with foley and transferring RN said pt was given IV lasix.  Decided to wait until urine subsided.  Pt refused to have taken out tonight.  Said he was not getting up every hour. RN will re-address later. Pt resting with call bell within reach.  Will continue to monitor. Thomas Hoff, RN

## 2014-04-21 NOTE — Care Management Note (Addendum)
    Page 1 of 2   04/25/2014     9:01:23 AM CARE MANAGEMENT NOTE 04/25/2014  Patient:  Peter Kennedy, Peter Kennedy   Account Number:  [id number]  Date Initiated:  04/20/2014  Documentation initiated by:  MAYO,HENRIETTA  Subjective/Objective Assessment:   dx CAD s/p CABG    PCP  Marnette Burgess NP with [facility]     Action/Plan:   Anticipated DC Date:  04/24/2014   Anticipated DC Plan:  HOME/SELF CARE  In-house referral  Financial Counselor      DC Planning Services  CM consult      Choice offered to / List presented to:     DME arranged  3-N-1  OXYGEN  SHOWER STOOL  WALKER - ROLLING      DME agency  [facility]        Status of service:  Completed, signed off Medicare Important Message given?   (If response is "NO", the following Medicare IM given date fields will be blank) Date Medicare IM given:   Date Additional Medicare IM given:    Discharge Disposition:  HOME/SELF CARE  Per UR Regulation:  Reviewed for med. necessity/level of care/duration of stay  If discussed at Long Length of Stay Meetings, dates discussed:    Comments:  04/25/14 08:55 CM received call from MD concerning DME.  CM notified DME delivery rep of pt discharge and pt's language barrier. 3n1, shower seat, oxygen, RW, will be delivered to room prior to discharge.   No HH needs were communicated. No other CM needs.  Freddy Jaksch, BSN, CM [phone].   ContactAdrean Molzahn, nephew [phone]                 Riffat Eula Listen, cousin  [phone]  04/21/14 1112 Henrietta Mayo RN MSN  BSN CCM TC to cousin, Riffat Eula Listen, talked with his wife who verifies that pt receives care @ [facility] and gets scripts filled with orange card.  Financial counselor to determine eligibility for MCD.  04/20/14 1040 Henrietta Mayo RN MSN BSN CCM Per chart and RN, pt has orange card and fills scripts @ [facility].  Spouse present but deaf  and no [place].  Talked with cousin and her spouse.  Spouse states Riffat Eula Listen is cousin that pt lives with, transports pts to appointments, etc.  Mr Eula Listen plans to visit later today, staff RN will call CM when he arrives.

## 2014-04-21 NOTE — Progress Notes (Signed)
Pt sts through translator that he has walked x2 today and would like to rest now. Will f/u tomorrow. Pt would like to walk with nursing this evening. Ethelda Chick CES, ACSM 2:32 PM 04/21/2014

## 2014-04-21 NOTE — Progress Notes (Signed)
2 Days Post-Op Procedure(s) (LRB): CORONARY ARTERY BYPASS GRAFTING (CABG)  (N/A) INTRAOPERATIVE TRANSESOPHAGEAL ECHOCARDIOGRAM (N/A) Subjective: No complaints  Objective: Vital signs in last 24 hours: Temp:  [97.6 F (36.4 C)-99.1 F (37.3 C)] 98.1 F (36.7 C) (05/20 0741) Pulse Rate:  [76-104] 83 (05/20 0700) Cardiac Rhythm:  [-] Normal sinus rhythm (05/20 0400) BP: (103-161)/(45-78) 124/48 mmHg (05/20 0700) SpO2:  [91 %-100 %] 100 % (05/20 0700) Arterial Line BP: (101-199)/(30-88) 199/65 mmHg (05/19 1800) FiO2 (%):  [24 %] 24 % (05/19 1826) Weight:  [66.5 kg (146 lb 9.7 oz)] 66.5 kg (146 lb 9.7 oz) (05/20 0600)  Hemodynamic parameters for last 24 hours: PAP: (35-49)/(20-25) 43/25 mmHg CO:  [4.5 L/min] 4.5 L/min CI:  [2.6 L/min/m2] 2.6 L/min/m2  Intake/Output from previous day: 05/19 0701 - 05/20 0700 In: 713.8 [I.V.:613.8; IV Piggyback:100] Out: 725 [Urine:665; Chest Tube:60] Intake/Output this shift:    General appearance: alert and cooperative Neurologic: intact Heart: regular rate and rhythm, S1, S2 normal, no murmur, click, rub or gallop Lungs: clear to auscultation bilaterally Extremities: edema mild Wound: dressing dry  Lab Results:  Recent Labs  04/20/14 1700 04/21/14 0430  WBC 16.3* 12.9*  HGB 8.3* 7.4*  HCT 25.2* 22.7*  PLT 141* 123*   BMET:  Recent Labs  04/20/14 0345 04/20/14 1700 04/21/14 0430  NA 138  --  140  K 4.3  --  3.9  CL 104  --  105  CO2 23  --  25  GLUCOSE 123*  --  70  BUN 15  --  18  CREATININE 0.91 0.88 0.88  CALCIUM 7.9*  --  8.0*    PT/INR:  Recent Labs  04/19/14 1215  LABPROT 17.4*  INR 1.47   ABG    Component Value Date/Time   PHART 7.369 04/19/2014 2003   HCO3 25.3* 04/19/2014 2003   TCO2 27 04/19/2014 2003   ACIDBASEDEF 1.0 04/19/2014 1744   O2SAT 98.0 04/19/2014 2003   CBG (last 3)   Recent Labs  04/20/14 2346 04/21/14 0353 04/21/14 0739  GLUCAP 128* 71 114*    Assessment/Plan: S/P Procedure(s)  (LRB): CORONARY ARTERY BYPASS GRAFTING (CABG)  (N/A) INTRAOPERATIVE TRANSESOPHAGEAL ECHOCARDIOGRAM (N/A) Hemodynamically stable off neo Mobilize Diuresis Diabetes control: He needs diabetic education. Expected acute blood loss anemia: observe for now. Start Fe2+ Plan for transfer to step-down: see transfer orders   LOS: 2 days    [name] 04/21/2014

## 2014-04-21 NOTE — Progress Notes (Signed)
Report called to Peter Kennedy.  All VS WNL upon transfer.  Pt transferred on 2L n/c.  Translator information provided to nurse. Nurse advised that male family members translate for pt very well.  Dr. Laneta Simmers was contacted and lopressor order obtained - pt's HR was in 90s to low 100s.  12.5mg  lopressor BID order placed per telephone order.  Family was walked to the room after pt was transferred.

## 2014-04-22 LAB — CBC
HCT: 25 % — ABNORMAL LOW (ref 39.0–52.0)
Hemoglobin: 8.1 g/dL — ABNORMAL LOW (ref 13.0–17.0)
MCH: 29.5 pg (ref 26.0–34.0)
MCHC: 32.4 g/dL (ref 30.0–36.0)
MCV: 90.9 fL (ref 78.0–100.0)
PLATELETS: 161 10*3/uL (ref 150–400)
RBC: 2.75 MIL/uL — AB (ref 4.22–5.81)
RDW: 14.3 % (ref 11.5–15.5)
WBC: 12.7 10*3/uL — AB (ref 4.0–10.5)

## 2014-04-22 LAB — PULMONARY FUNCTION TEST
DL/VA % PRED: 59 %
DL/VA: 2.6 ml/min/mmHg/L
DLCO cor % pred: 35 %
DLCO cor: 10.14 ml/min/mmHg
DLCO unc % pred: 35 %
DLCO unc: 10.14 ml/min/mmHg
FEF 25-75 Post: 1.12 L/sec
FEF 25-75 Pre: 0.68 L/sec
FEF2575-%CHANGE-POST: 65 %
FEF2575-%PRED-POST: 61 %
FEF2575-%Pred-Pre: 36 %
FEV1-%CHANGE-POST: 11 %
FEV1-%PRED-POST: 63 %
FEV1-%PRED-PRE: 57 %
FEV1-POST: 1.66 L
FEV1-PRE: 1.49 L
FEV1FVC-%CHANGE-POST: -10 %
FEV1FVC-%PRED-PRE: 87 %
FEV6-%Change-Post: 22 %
FEV6-%Pred-Post: 84 %
FEV6-%Pred-Pre: 68 %
FEV6-Post: 2.87 L
FEV6-Pre: 2.33 L
FEV6FVC-%Change-Post: -1 %
FEV6FVC-%PRED-PRE: 106 %
FEV6FVC-%Pred-Post: 105 %
FVC-%CHANGE-POST: 24 %
FVC-%Pred-Post: 80 %
FVC-%Pred-Pre: 64 %
FVC-Post: 2.93 L
FVC-Pre: 2.36 L
POST FEV1/FVC RATIO: 56 %
PRE FEV1/FVC RATIO: 63 %
PRE FEV6/FVC RATIO: 99 %
Post FEV6/FVC ratio: 98 %
RV % PRED: 137 %
RV: 3.36 L
TLC % pred: 92 %
TLC: 5.95 L

## 2014-04-22 LAB — GLUCOSE, CAPILLARY
GLUCOSE-CAPILLARY: 99 mg/dL (ref 70–99)
GLUCOSE-CAPILLARY: 99 mg/dL (ref 70–99)
Glucose-Capillary: 116 mg/dL — ABNORMAL HIGH (ref 70–99)

## 2014-04-22 LAB — BASIC METABOLIC PANEL
BUN: 23 mg/dL (ref 6–23)
CHLORIDE: 104 meq/L (ref 96–112)
CO2: 26 mEq/L (ref 19–32)
Calcium: 8.1 mg/dL — ABNORMAL LOW (ref 8.4–10.5)
Creatinine, Ser: 1.14 mg/dL (ref 0.50–1.35)
GFR calc Af Amer: 70 mL/min — ABNORMAL LOW (ref >90)
GFR calc non Af Amer: 60 mL/min — ABNORMAL LOW (ref >90)
Glucose, Bld: 109 mg/dL — ABNORMAL HIGH (ref 70–99)
Potassium: 5 mEq/L (ref 3.7–5.3)
SODIUM: 140 meq/L (ref 137–147)

## 2014-04-22 MED ORDER — METOPROLOL TARTRATE 25 MG PO TABS
25.0000 mg | ORAL_TABLET | Freq: Two times a day (BID) | ORAL | Status: DC
  Administered 2014-04-22 – 2014-04-25 (×7): 25 mg via ORAL
  Filled 2014-04-22 (×8): qty 1

## 2014-04-22 MED ORDER — LEVALBUTEROL HCL 0.63 MG/3ML IN NEBU: 0.6300 mg | INHALATION_SOLUTION | Freq: Four times a day (QID) | RESPIRATORY_TRACT | Status: DC | PRN

## 2014-04-22 MED ORDER — LEVALBUTEROL HCL 0.63 MG/3ML IN NEBU
0.6300 mg | INHALATION_SOLUTION | Freq: Four times a day (QID) | RESPIRATORY_TRACT | Status: DC
  Administered 2014-04-23 – 2014-04-24 (×8): 0.63 mg via RESPIRATORY_TRACT
  Filled 2014-04-22 (×21): qty 3

## 2014-04-22 MED ORDER — LEVALBUTEROL HCL 0.63 MG/3ML IN NEBU
0.6300 mg | INHALATION_SOLUTION | Freq: Four times a day (QID) | RESPIRATORY_TRACT | Status: DC
  Administered 2014-04-22 (×2): 0.63 mg via RESPIRATORY_TRACT
  Filled 2014-04-22 (×2): qty 3

## 2014-04-22 MED ORDER — GUAIFENESIN ER 600 MG PO TB12
1200.0000 mg | ORAL_TABLET | Freq: Two times a day (BID) | ORAL | Status: DC
  Administered 2014-04-22 – 2014-04-25 (×7): 1200 mg via ORAL
  Filled 2014-04-22 (×8): qty 2

## 2014-04-22 NOTE — Progress Notes (Signed)
CARDIAC REHAB PHASE I   PRE:  Rate/Rhythm: 100 ST    BP: sitting 170/82    SaO2: 96 2 1/2L  MODE:  Ambulation: 480 ft   POST:  Rate/Rhythm: 119 ST    BP: sitting 130/76     SaO2: 96 3L  Pt very stiff getting out of bed and standing. Attempted to encourage pt to relax. Fairly steady walking however SaO2 85 2L after 100 ft. Attempted to teach pursed lip breathing however pt struggled to understand. Increased O2 to 3L. On return to room pt had good coughing effort and then SaO2 was 96 3L. To recliner. Pt continues to "be like an ironing board" when sitting and standing. Attempted to show how to sit and stand however communication is difficult. Interpreter present but it seems pt is not listening as well. Practiced IS in recliner. Encouraged pt to stay out of bed and walk x2 more. [phone]   Megan Salon CES, ACSM 04/22/2014 11:10 AM

## 2014-04-22 NOTE — Progress Notes (Addendum)
       [address]       [place] [address]             [phone]          3 Days Post-Op Procedure(s) (LRB): CORONARY ARTERY BYPASS GRAFTING (CABG)  (N/A) INTRAOPERATIVE TRANSESOPHAGEAL ECHOCARDIOGRAM (N/A)  Subjective: Had a rough night, coughing a lot, mostly nonproductive per wife.  Also, didn't get a lot of rest due to frequent voiding after Lasix.   Objective: Vital signs in last 24 hours: Patient Vitals for the past 24 hrs:  BP Temp Temp src Pulse Resp SpO2 Weight  04/22/14 0500 - - - - - - 142 lb 3.2 oz (64.5 kg)  04/22/14 0400 144/66 mmHg 98.4 F (36.9 C) Oral 95 22 98 % -  04/21/14 2101 - - - - - 100 % -  04/21/14 2049 121/57 mmHg 97.8 F (36.6 C) Oral 95 - 97 % -  04/21/14 1225 152/63 mmHg 98 F (36.7 C) Oral 92 - 94 % -  04/21/14 1200 - - - 97 - 97 % -  04/21/14 1148 - 98.4 F (36.9 C) Oral - - - -  04/21/14 1100 - - - 99 - 100 % -  04/21/14 1000 - - - 107 - 89 % -  04/21/14 0900 129/64 mmHg - - 85 - 100 % -  04/21/14 0858 - - - - - 96 % -  04/21/14 0800 111/41 mmHg - - 78 - 100 % -   Current Weight  04/22/14 142 lb 3.2 oz (64.5 kg)  PRE-OPERATIVE WEIGHT: 62 kg    Intake/Output from previous day: 05/20 0701 - 05/21 0700 In: 90 [I.V.:40; IV Piggyback:50] Out: 2695 [Urine:2695] CBGs 125-112-109-99   PHYSICAL EXAM:  Heart: RRR Lungs: Diffuse wheezing throughout with rhonchi in bases Wound: Clean and dry Extremities: No significant LE edema    Lab Results: CBC: Recent Labs  04/21/14 0430 04/22/14 0435  WBC 12.9* 12.7*  HGB 7.4* 8.1*  HCT 22.7* 25.0*  PLT 123* 161   BMET:  Recent Labs  04/21/14 0430 04/22/14 0435  NA 140 140  K 3.9 5.0  CL 105 104  CO2 25 26  GLUCOSE 70 109*  BUN 18 23  CREATININE 0.88 1.14  CALCIUM 8.0* 8.1*    PT/INR:  Recent Labs  04/19/14 1215  LABPROT 17.4*  INR 1.47      Assessment/Plan: S/P Procedure(s) (LRB): CORONARY ARTERY BYPASS GRAFTING (CABG)   (N/A) INTRAOPERATIVE TRANSESOPHAGEAL ECHOCARDIOGRAM (N/A)  CV- maintaining SR, BPs trending up and HRs 90-100.  Will increase beta blocker and watch.  May need to resume ARB if BPs continue to rise.  Pulm- lots of wheezing and cough this am.  Continue nebs, Dulera.  Will add Mucinex, flutter valve.  Vol overload- diurese.  CRPI, ambulation.  DM- sugars generally stable. Continue SSI.     LOS: 3 days    Wilmon Pali 04/22/2014   Chart reviewed, patient examined, agree with above. Preop PFT's showed severe airflow limitation and severe reduction in diffusion capacity. He was significantly improved with bronchodilators. Lungs very emphysematous appearing in the OR. Continue Xopenex every 6 hrs. Add guaifenesin.

## 2014-04-23 LAB — GLUCOSE, CAPILLARY
GLUCOSE-CAPILLARY: 132 mg/dL — AB (ref 70–99)
Glucose-Capillary: 142 mg/dL — ABNORMAL HIGH (ref 70–99)
Glucose-Capillary: 90 mg/dL (ref 70–99)
Glucose-Capillary: 118 mg/dL — ABNORMAL HIGH (ref 70–99)
Glucose-Capillary: 105 mg/dL — ABNORMAL HIGH (ref 70–99)

## 2014-04-23 NOTE — Progress Notes (Signed)
CARDIAC REHAB PHASE I   PRE:  Rate/Rhythm: 91 SR  BP:  Supine: 120/80  Sitting:   Standing:    SaO2: 99% 3L  MODE:  Ambulation: 550 ft   POST:  Rate/Rhythm: 102  BP:  Supine:   Sitting: 98/71  Standing:    SaO2: 88% 2L, 95% hall and room 1335-1410 Pt very stiff getting OOB as he does not want to bend. Cousin helped me get pt OOB and helped with interpretation. Pt walked 550 ft with rolling walker and asst x1 with cousin also helping pt steer walker. Tried pt on 2L but took 3L to keep sats up. Encouraged IS. When returning to room, tried to encourage pt to go to recliner without success. Encouraged him to sit up as much as possible. Had been up for a little while this morning but had not walked in hall today.  BP lower after walk and he stated he has been feeling dizzy today per cousin's interpretation. To bed and left on 3L. Using IS.   Luetta Nutting, RN BSN  04/23/2014 2:07 PM

## 2014-04-23 NOTE — Progress Notes (Addendum)
       [address]       [place] [address]             [phone]          4 Days Post-Op Procedure(s) (LRB): CORONARY ARTERY BYPASS GRAFTING (CABG)  (N/A) INTRAOPERATIVE TRANSESOPHAGEAL ECHOCARDIOGRAM (N/A)  Subjective: Feels a little better today.  Cough decreased. Not eating well.   Objective: Vital signs in last 24 hours: Patient Vitals for the past 24 hrs:  BP Temp Temp src Pulse Resp SpO2 Weight  04/23/14 0500 120/60 mmHg 97.8 F (36.6 C) Oral 84 22 100 % 139 lb 12.8 oz (63.413 kg)  04/22/14 2058 - - - - - 99 % -  04/22/14 1930 136/53 mmHg 97.9 F (36.6 C) Oral 82 22 99 % -  04/22/14 1427 - - - - - 98 % -  04/22/14 0846 - - - - - 99 % -   Current Weight  04/23/14 139 lb 12.8 oz (63.413 kg)  PRE-OPERATIVE WEIGHT: 62 kg    Intake/Output from previous day: 05/21 0701 - 05/22 0700 In: -  Out: 450 [Urine:450]  CBGs 99-142-90   PHYSICAL EXAM:  Heart: RRR Lungs: Decreased BS bilaterally, rare wheezes Wound: Clean and dry Extremities: No LE edema    Lab Results: CBC: Recent Labs  04/21/14 0430 04/22/14 0435  WBC 12.9* 12.7*  HGB 7.4* 8.1*  HCT 22.7* 25.0*  PLT 123* 161   BMET:  Recent Labs  04/21/14 0430 04/22/14 0435  NA 140 140  K 3.9 5.0  CL 105 104  CO2 25 26  GLUCOSE 70 109*  BUN 18 23  CREATININE 0.88 1.14  CALCIUM 8.0* 8.1*    PT/INR: No results found for this basename: LABPROT, INR,  in the last 72 hours    Assessment/Plan: S/P Procedure(s) (LRB): CORONARY ARTERY BYPASS GRAFTING (CABG)  (N/A) INTRAOPERATIVE TRANSESOPHAGEAL ECHOCARDIOGRAM (N/A) CV- maintaining SR, BPs improved.  Continue Lopressor. Will resume ARB if BPs continue to trend up. Pulm- Improved this am. Continue nebs, Dulera, Mucinex,IS/ flutter valve. Wean O2 as tolerated. CRPI, ambulation.  Elevated CBGs- Controlled on SSI. A1C=6.4. Will need OP followup. Expected postop blood loss anemia- follow labs in am. Possibly home 2-3 days if he  continues to improve.   LOS: 4 days    Wilmon Pali 04/23/2014   Chart reviewed, patient examined, agree with above. He sounds much better today.

## 2014-04-23 NOTE — Discharge Summary (Signed)
[address]       [place] [address]             [phone]              Discharge Summary  Name: Peter Kennedy DOB: [birth year] 79 y.o. MRN: [id number]   Admission Date: 04/19/2014 Discharge Date: 04/25/2014    Admitting Diagnosis: Severe 3 vessel coronary artery disease  Discharge Diagnosis:  Severe 3 vessel coronary artery disease Expected postoperative blood loss anemia  Past Medical History  Diagnosis Date  . Hypertension   . Coronary artery disease   . SOB (shortness of breath)   . Eye inflamed   . Hx of cardiovascular stress test     Lexiscan Myoview (01/2014):  Inf, apical and IL scar; no ischemia; EF 38%; Mod Risk.  Marland Kitchen GERD (gastroesophageal reflux disease)     very spicy food,does not take anything     Procedures: CORONARY ARTERY BYPASS GRAFTING x 3 (LIMA-LAD, SVG-OM, SVG-RCA)  ENDOSCOPIC VEIN HARVEST BILATERAL THIGHS -  04/19/2014   HPI:  The patient is a 79 y.o. male with a history of uncontrolled hypertension who presented with exertional shortness of breath and mild chest pain. He was found to have an abnormal EKG with a RBBB with nonspecific ST-T changes. He underwent a myocardial perfusion scan on 02/03/2014 showing an EF of 38% with evidence of old scar involving the inferior wall and apex and part of the anterolateral wall. There was hypokinesis of the septum, inferior and lateral walls. There were no symptoms or EKG changes. It was a moderate risk study, so he had a cardiac catheterization on 04/05/2014 showing severe 3-vessel disease with an EF of 25% with a LVEDP of 22. He was referred to Dr. Laneta Simmers for consideration of surgical revascularization.  It was felt that he would benefit from CABG at this time. All risks, benefits and alternatives of surgery were explained in detail, and the patient agreed to proceed.     Hospital Course:  The patient was admitted to [facility] on 04/19/2014. The patient was taken to the operating  room and underwent the above procedure.    The postoperative course has generally been uneventful.  He has had a cough and some wheezing and has been treated with aggressive pulmonary toilet measures, bronchodilators, and mucolytics with improvement.  However, he has been unable to be weaned from oxygen.  Therefore, home oxygen has been arranged.  He has remained in sinus rhythm.  Blood pressures are trending up, and he has been started on the appropriate anti-hypertensives. Incisions are healing well. Lasix was started for a mild volume overload and the patient is diuresing well.  However, he later developed some renal insufficiency with volume status at baseline and his lasix was discontinued.  He was placed on sliding scale insulin postop for elevated CBGs and blood sugars have remained stable.  Hemoglobin A1C preop was 6.4 and he will need outpatient followup.  He is ambulating in the hall without difficulty and tolerating a regular diet.  We anticipate discharge home in 24-48 hours provided no acute changes occur.     Recent vital signs:  Filed Vitals:   04/23/14 0842  BP: 138/57  Pulse: 93  Temp:   Resp:     Recent laboratory studies:  CBC: Recent Labs  04/21/14 0430 04/22/14 0435  WBC 12.9* 12.7*  HGB 7.4* 8.1*  HCT 22.7* 25.0*  PLT 123* 161   BMET:  Recent  Labs  04/21/14 0430 04/22/14 0435  NA 140 140  K 3.9 5.0  CL 105 104  CO2 25 26  GLUCOSE 70 109*  BUN 18 23  CREATININE 0.88 1.14  CALCIUM 8.0* 8.1*    PT/INR: No results found for this basename: LABPROT, INR,  in the last 72 hours   Discharge Medications:      Medication List    STOP taking these medications       losartan-hydrochlorothiazide 50-12.5 MG per tablet  Commonly known as:  HYZAAR     metoprolol succinate 25 MG 24 hr tablet  Commonly known as:  TOPROL XL     nitroGLYCERIN 0.4 MG SL tablet  Commonly known as:  NITROSTAT      TAKE these medications       acetaminophen 325 MG tablet    Commonly known as:  TYLENOL  Take 325-650 mg by mouth every 6 (six) hours as needed for mild pain or headache.     albuterol 108 (90 BASE) MCG/ACT inhaler  Commonly known as:  PROVENTIL HFA;VENTOLIN HFA  Inhale 2 puffs into the lungs every 6 (six) hours as needed for wheezing or shortness of breath.     aspirin 325 MG EC tablet  Take 1 tablet (325 mg total) by mouth daily.     atorvastatin 40 MG tablet  Commonly known as:  LIPITOR  Take 1 tablet (40 mg total) by mouth daily.     Fluticasone-Salmeterol 250-50 MCG/DOSE Aepb  Commonly known as:  ADVAIR  Inhale 1 puff into the lungs 2 (two) times daily.     guaiFENesin 600 MG 12 hr tablet  Commonly known as:  MUCINEX  Take 2 tablets (1,200 mg total) by mouth 2 (two) times daily as needed for cough.     metoprolol tartrate 25 MG tablet  Commonly known as:  LOPRESSOR  Take 1 tablet (25 mg total) by mouth 2 (two) times daily.     MULTIVITAMIN PO  Take by mouth daily.     oxyCODONE 5 MG immediate release tablet  Commonly known as:  Oxy IR/ROXICODONE  Take 1-2 tablets (5-10 mg total) by mouth every 3 (three) hours as needed for severe pain.          Discharge Instructions:  The patient is to refrain from driving, heavy lifting or strenuous activity.  May shower daily and clean incisions with soap and water.  May resume regular diet.   Follow Up:  Follow-up Information   Follow up with Alleen BorneBARTLE,BRYAN K, MD On 05/19/2014. ([facility] at 2:30, then see MD at 3:30)    Specialty:  Cardiothoracic Surgery   Contact information:   [address] [place] [address] [phone]       Follow up with Donato SchultzSKAINS, MARK, MD In 2 weeks. (Office will contact you with an appointment)    Specialty:  Cardiology   Contact information:   [address] [place] [address] [phone]      The patient has been discharged on:  1.Beta Blocker: Yes [ x ]  No [ ]   If No, reason:     2.Ace Inhibitor/ARB: Yes [ x ]  No [  ]  If No, reason:    3.Statin: Yes [ x ]  No [ ]   If No, reason:    4.Ecasa: Yes [ x ]  No [ ]   If No, reason:     Gina L  Collins 04/23/2014, 11:57 AM

## 2014-04-24 LAB — CBC
HCT: 26.1 % — ABNORMAL LOW (ref 39.0–52.0)
HEMOGLOBIN: 8.7 g/dL — AB (ref 13.0–17.0)
MCH: 30.7 pg (ref 26.0–34.0)
MCHC: 33.3 g/dL (ref 30.0–36.0)
MCV: 92.2 fL (ref 78.0–100.0)
Platelets: 259 10*3/uL (ref 150–400)
RBC: 2.83 MIL/uL — ABNORMAL LOW (ref 4.22–5.81)
RDW: 14.1 % (ref 11.5–15.5)
WBC: 11.3 10*3/uL — ABNORMAL HIGH (ref 4.0–10.5)

## 2014-04-24 LAB — BASIC METABOLIC PANEL
BUN: 32 mg/dL — AB (ref 6–23)
CO2: 27 meq/L (ref 19–32)
Calcium: 8.8 mg/dL (ref 8.4–10.5)
Chloride: 102 mEq/L (ref 96–112)
Creatinine, Ser: 1.22 mg/dL (ref 0.50–1.35)
GFR calc Af Amer: 64 mL/min — ABNORMAL LOW (ref >90)
GFR, EST NON AFRICAN AMERICAN: 55 mL/min — AB (ref >90)
Glucose, Bld: 118 mg/dL — ABNORMAL HIGH (ref 70–99)
Potassium: 4.7 mEq/L (ref 3.7–5.3)
Sodium: 141 mEq/L (ref 137–147)

## 2014-04-24 LAB — GLUCOSE, CAPILLARY
GLUCOSE-CAPILLARY: 98 mg/dL (ref 70–99)
GLUCOSE-CAPILLARY: 85 mg/dL (ref 70–99)
Glucose-Capillary: 108 mg/dL — ABNORMAL HIGH (ref 70–99)
Glucose-Capillary: 114 mg/dL — ABNORMAL HIGH (ref 70–99)
Glucose-Capillary: 162 mg/dL — ABNORMAL HIGH (ref 70–99)

## 2014-04-24 MED ORDER — POLYSACCHARIDE IRON COMPLEX 150 MG PO CAPS
150.0000 mg | ORAL_CAPSULE | Freq: Every day | ORAL | Status: DC
  Administered 2014-04-24 – 2014-04-25 (×2): 150 mg via ORAL
  Filled 2014-04-24 (×2): qty 1

## 2014-04-24 MED ORDER — LACTULOSE 10 GM/15ML PO SOLN
20.0000 g | Freq: Once | ORAL | Status: AC
  Administered 2014-04-24: 20 g via ORAL
  Filled 2014-04-24: qty 30

## 2014-04-24 NOTE — Progress Notes (Signed)
CARDIAC REHAB PHASE I   PRE:  Rate/Rhythm: 86 SR  BP:  Supine:   Sitting: 100/70  Standing:    SaO2: 92%2L   91%RA  MODE:  Ambulation: 500 ft   POST:  Rate/Rhythm: 93  BP:  Supine:   Sitting: 100/60  Standing:    SaO2: 87%RA, 88%2L,92% 3L  SATURATION QUALIFICATIONS: (This note is used to comply with regulatory documentation for home oxygen)  Patient Saturations on Room Air at Rest = 91%  Patient Saturations on Room Air while Ambulating = 87%  Patient Saturations on 3 Liters of oxygen while Ambulating = 92%  Please briefly explain why patient needs home oxygen:took 3L to keep sats up when walking 1315-1410 Pt walked 500 ft with rolling walker and minimal asst. Took 3L oxygen to keep sats up. To recliner after walk. Education completed with nephew who interpreted. Signed form on chart for interpretation. Encouraged IS and pt demonstrated correct use. Would recommend rolling walker for home use. Discussed CRP 2 and permission given to refer to [facility] program. Pt not as stiff getting up today. Stressed sternal precautions.     Luetta Nutting, RN BSN  04/24/2014 2:05 PM

## 2014-04-24 NOTE — Progress Notes (Addendum)
EPW removed per protocol intact, patient tolerated well, instructed bedrest for 1 hour, by use of phone interpreter 151 88 heart rate 96 routine vital signs started per protocol Latricia Heft

## 2014-04-24 NOTE — Progress Notes (Addendum)
      [address]       [place] [address]             [phone]      5 Days Post-Op Procedure(s) (LRB): CORONARY ARTERY BYPASS GRAFTING (CABG)  (N/A) INTRAOPERATIVE TRANSESOPHAGEAL ECHOCARDIOGRAM (N/A)  Subjective:  No new complaints.  States he feels like he is breathing better.  Family at bedside provided translation  Objective: Vital signs in last 24 hours: Temp:  [98 F (36.7 C)-99.3 F (37.4 C)] 98.5 F (36.9 C) (05/23 0404) Pulse Rate:  [84-96] 84 (05/23 0404) Cardiac Rhythm:  [-]  Resp:  [20-21] 21 (05/23 0404) BP: (105-138)/(57-64) 109/60 mmHg (05/23 0404) SpO2:  [94 %-100 %] 98 % (05/23 0730) Weight:  [138 lb 4.8 oz (62.732 kg)] 138 lb 4.8 oz (62.732 kg) (05/23 0404)  Intake/Output from previous day: 05/22 0701 - 05/23 0700 In: -  Out: 200 [Urine:200]  General appearance: alert, cooperative and no distress Heart: regular rate and rhythm Lungs: diminished in bases bilaterally Abdomen: soft, non-tender; bowel sounds normal; no masses,  no organomegaly Extremities: edema no edema present Wound: clean and dry  Lab Results:  Recent Labs  04/22/14 0435 04/24/14 0250  WBC 12.7* 11.3*  HGB 8.1* 8.7*  HCT 25.0* 26.1*  PLT 161 259   BMET:  Recent Labs  04/22/14 0435 04/24/14 0250  NA 140 141  K 5.0 4.7  CL 104 102  CO2 26 27  GLUCOSE 109* 118*  BUN 23 32*  CREATININE 1.14 1.22  CALCIUM 8.1* 8.8    PT/INR: No results found for this basename: LABPROT, INR,  in the last 72 hours ABG    Component Value Date/Time   PHART 7.369 04/19/2014 2003   HCO3 25.3* 04/19/2014 2003   TCO2 27 04/19/2014 2003   ACIDBASEDEF 1.0 04/19/2014 1744   O2SAT 98.0 04/19/2014 2003   CBG (last 3)   Recent Labs  04/23/14 2116 04/24/14 0623 04/24/14 0637  GLUCAP 105* 98 85    Assessment/Plan: S/P Procedure(s) (LRB): CORONARY ARTERY BYPASS GRAFTING (CABG)  (N/A) INTRAOPERATIVE TRANSESOPHAGEAL ECHOCARDIOGRAM (N/A)  1. CV- NSR, blood pressure  okay this morning- will continue Lopressor 2. Pulm- continues to improve, receiving respiratory treatment, continue flutter valve, weaning oxygen as tolerated 3. Renal- creatinine slowly trending up, weight is 2 lbs above baseline, no LE edema present, will d/c Lasix, repeat BMET in AM 4. Expected Acute Blood Loss Anemia- improving, Hgb at 8.7, will start Iron 5. GI- LOC Constipation- will order lactulose 6. Dispo- patient improving, will d/c EPW, continue Pulm Toilet, repeat BMET in AM to assess no further rise in creatinine- d/c pending respiratory status   LOS: 5 days    Lowella Dandy 04/24/2014  I have seen and examined the patient and agree with the assessment and plan as outlined.  Tentatively plan d/c tomorrow if he continues to improve.  Purcell Nails 04/24/2014 12:45 PM

## 2014-04-25 LAB — BASIC METABOLIC PANEL
BUN: 26 mg/dL — AB (ref 6–23)
CO2: 25 mEq/L (ref 19–32)
CREATININE: 0.92 mg/dL (ref 0.50–1.35)
Calcium: 8.7 mg/dL (ref 8.4–10.5)
Chloride: 103 mEq/L (ref 96–112)
GFR calc Af Amer: >90 mL/min (ref >90)
GFR, EST NON AFRICAN AMERICAN: 79 mL/min — AB (ref >90)
GLUCOSE: 86 mg/dL (ref 70–99)
POTASSIUM: 4.5 meq/L (ref 3.7–5.3)
Sodium: 141 mEq/L (ref 137–147)

## 2014-04-25 LAB — GLUCOSE, CAPILLARY
Glucose-Capillary: 86 mg/dL (ref 70–99)
Glucose-Capillary: 119 mg/dL — ABNORMAL HIGH (ref 70–99)

## 2014-04-25 MED ORDER — ASPIRIN 325 MG PO TBEC: 325.0000 mg | DELAYED_RELEASE_TABLET | Freq: Every day | ORAL | Status: AC

## 2014-04-25 MED ORDER — METOPROLOL TARTRATE 25 MG PO TABS: 25.0000 mg | ORAL_TABLET | Freq: Two times a day (BID) | ORAL | Status: DC

## 2014-04-25 MED ORDER — OXYCODONE HCL 5 MG PO TABS: 5.0000 mg | ORAL_TABLET | ORAL | Status: AC | PRN

## 2014-04-25 MED ORDER — GUAIFENESIN ER 600 MG PO TB12: 1200.0000 mg | ORAL_TABLET | Freq: Two times a day (BID) | ORAL | Status: AC | PRN

## 2014-04-25 MED ORDER — LEVALBUTEROL HCL 0.63 MG/3ML IN NEBU
0.6300 mg | INHALATION_SOLUTION | Freq: Four times a day (QID) | RESPIRATORY_TRACT | Status: DC | PRN
Start: 2014-04-25 — End: 2014-04-25

## 2014-04-25 NOTE — Progress Notes (Addendum)
Reviewed discharge instructions with patient with designated interpreter, reviewing medications, activity, when to call the MD, oxygen instructions . DC home with cousin that states he will assist patient with medication or another cousin will available to assist patient and his wife at home Georgette Dover

## 2014-04-25 NOTE — Progress Notes (Signed)
CT sutures removed per protocol edges intact benzoin and steristrips applied Georgette Dover

## 2014-04-25 NOTE — Progress Notes (Addendum)
      [address]       [place] [address]             [phone]      6 Days Post-Op Procedure(s) (LRB): CORONARY ARTERY BYPASS GRAFTING (CABG)  (N/A) INTRAOPERATIVE TRANSESOPHAGEAL ECHOCARDIOGRAM (N/A)  Subjective:  Mr. Schierman has no new complaints.  He continues to cough but has been able to expectorate sputum.  Family at bedside to translate.  Requesting shower chair and potty seat for discharge.  Objective: Vital signs in last 24 hours: Temp:  [97.6 F (36.4 C)-98 F (36.7 C)] 98 F (36.7 C) (05/24 0434) Pulse Rate:  [83-98] 85 (05/24 0434) Cardiac Rhythm:  [-]  Resp:  [18-21] 21 (05/24 0434) BP: (99-151)/(44-88) 147/79 mmHg (05/24 0434) SpO2:  [88 %-100 %] 98 % (05/24 0434) Weight:  [138 lb (62.596 kg)] 138 lb (62.596 kg) (05/24 0434)  Intake/Output from previous day: 05/23 0701 - 05/24 0700 In: 360 [P.O.:360] Out: 175 [Urine:175]  General appearance: alert, cooperative and no distress Heart: regular rate and rhythm Lungs: clear to auscultation bilaterally Abdomen: soft, non-tender; bowel sounds normal; no masses,  no organomegaly Extremities: edema none appreciated Wound: clean and dry  Lab Results:  Recent Labs  04/24/14 0250  WBC 11.3*  HGB 8.7*  HCT 26.1*  PLT 259   BMET:  Recent Labs  04/24/14 0250 04/25/14 0430  NA 141 141  K 4.7 4.5  CL 102 103  CO2 27 25  GLUCOSE 118* 86  BUN 32* 26*  CREATININE 1.22 0.92  CALCIUM 8.8 8.7    PT/INR: No results found for this basename: LABPROT, INR,  in the last 72 hours ABG    Component Value Date/Time   PHART 7.369 04/19/2014 2003   HCO3 25.3* 04/19/2014 2003   TCO2 27 04/19/2014 2003   ACIDBASEDEF 1.0 04/19/2014 1744   O2SAT 98.0 04/19/2014 2003   CBG (last 3)   Recent Labs  04/24/14 1620 04/24/14 2130 04/25/14 0601  GLUCAP 114* 162* 86    Assessment/Plan: S/P Procedure(s) (LRB): CORONARY ARTERY BYPASS GRAFTING (CABG)  (N/A) INTRAOPERATIVE TRANSESOPHAGEAL  ECHOCARDIOGRAM (N/A)  1. CV- NSR- blood pressure elevated this morning, will continue Lopressor, will resume home Cozaar at half dose 2. Pulm- unable to wean oxygen, good use of IS, will make arrangements for home o2 3. Renal- creatinine has trended back down WNL, will continue to hold diuretic 4.  GI- LOC constipation, resolved 5. Dispo-patient stable, respiratory status improved, will arrange home oxygen, plan to d/c patient today   LOS: 6 days    Lowella Dandy 04/25/2014  I have seen and examined the patient and agree with the assessment and plan as outlined.  Purcell Nails 04/25/2014 10:22 AM

## 2014-04-25 NOTE — Discharge Instructions (Signed)
Coronary Artery Bypass Grafting, Care After °Refer to this sheet in the next few weeks. These instructions provide you with information on caring for yourself after your procedure. Your health care provider may also give you more specific instructions. Your treatment has been planned according to current medical practices, but problems sometimes occur. Call your health care provider if you have any problems or questions after your procedure. °WHAT TO EXPECT AFTER THE PROCEDURE °Recovery from surgery will be different for everyone. Some people feel well after 3 or 4 weeks, while for others it takes longer. After your procedure, it is typical to have the following: °· Nausea and a lack of appetite.   °· Constipation. °· Weakness and fatigue.   °· Depression or irritability.   °· Pain or discomfort at your incision site. °HOME CARE INSTRUCTIONS °· Only take over-the-counter or prescription medicines as directed by your health care provider. Take all medicines exactly as directed. Do not stop taking medicines or start any new medicines without first checking with your health care provider.   °· Take your pulse as directed by your health care provider. °· Perform deep breathing as directed by your health care provider. If you were given a device called an incentive spirometer, use it to practice deep breathing several times a day. Support your chest with a pillow or your arms when you take deep breaths or cough. °· Keep incision areas clean, dry, and protected. Remove or change any bandages (dressings) only as directed by your health care provider. You may have skin adhesive strips over the incision areas. Do not take the strips off. They will fall off on their own. °· Check incision areas daily for any swelling, redness, or drainage. °· If incisions were made in your legs, do the following: °· Avoid crossing your legs.   °· Avoid sitting for long periods of time. Change positions every 30 minutes.   °· Elevate your legs  when you are sitting.   °· Wear compression stockings as directed by your health care provider. These stockings help keep blood clots from forming in your legs. °· Take showers once your health care provider approves. Until then, only take sponge baths. Pat incisions dry. Do not rub incisions with a washcloth or towel. Do not take tub baths or go swimming until your health care provider approves. °· Eat foods that are high in fiber, such as raw fruits and vegetables, whole grains, beans, and nuts. Meats should be lean cut. Avoid canned, processed, and fried foods. °· Drink enough fluids to keep your urine clear or pale yellow. °· Weigh yourself every day. This helps identify if you are retaining fluid that may make your heart and lungs work harder.   °· Rest and limit activity as directed by your health care provider. You may be instructed to: °· Stop any activity at once if you have chest pain, shortness of breath, irregular heartbeats, or dizziness. Get help right away if you have any of these symptoms. °· Move around frequently for short periods or take short walks as directed by your health care provider. Increase your activities gradually. You may need physical therapy or cardiac rehabilitation to help strengthen your muscles and build your endurance. °· Avoid lifting, pushing, or pulling anything heavier than 10 lb (4.5 kg) for at least 6 weeks after surgery. °· Do not drive until your health care provider approves.  °· Ask your health care provider when you may return to work and resume sexual activity. °· Follow up with your health care provider as   directed.   °SEEK MEDICAL CARE IF: °· You have swelling, redness, increasing pain, or drainage at the site of an incision.   °· You develop a fever.   °· You have swelling in your ankles or legs.   °· You have pain in your legs.   °· You have weight gain of 2 or more pounds a day. °· You are nauseous or vomit. °· You have diarrhea.  °SEEK IMMEDIATE MEDICAL CARE  IF: °· You have chest pain that goes to your jaw or arms. °· You have shortness of breath.   °· You have a fast or irregular heartbeat.   °· You notice a "clicking" in your breastbone (sternum) when you move.   °· You have numbness or weakness in your arms or legs. °· You feel dizzy or lightheaded.   °MAKE SURE YOU: °· Understand these instructions. °· Will watch your condition. °· Will get help right away if you are not doing well or get worse. °Document Released: 06/08/2005 Document Revised: 07/22/2013 Document Reviewed: 04/28/2013 °Exit[employer]® Patient Information ©2014 [employer]. ° °Endoscopic Saphenous Vein Harvesting °Care After °Refer to this sheet in the next few weeks. These instructions provide you with information on caring for yourself after your procedure. Your caregiver may also give you more specific instructions. Your treatment has been planned according to current medical practices, but problems sometimes occur. Call your caregiver if you have any problems or questions after your procedure. °HOME CARE INSTRUCTIONS °Medicine °· Take whatever pain medicine your surgeon prescribes. Follow the directions carefully. Do not take over-the-counter pain medicine unless your surgeon says it is okay. Some pain medicine can cause bleeding problems for several weeks after surgery. °· Follow your surgeon's instructions about driving. You will probably not be permitted to drive after heart surgery. °· Take any medicines your surgeon prescribes. Any medicines you took before your heart surgery should be checked with your caregiver before you start taking them again. °Wound care °· Ask your surgeon how long you should keep wearing your elastic bandage or stocking. °· Check the area around your surgical cuts (incisions) whenever your bandages (dressings) are changed. Look for any redness or swelling. °· You will need to return to have the stitches (sutures) or staples taken out. Ask your surgeon when to do  that. °· Ask your surgeon when you can shower or bathe. °Activity °· Try to keep your legs raised when you are sitting. °· Do any exercises your caregivers have given you. These may include deep breathing exercises, coughing, walking, or other exercises. °SEEK MEDICAL CARE IF: °· You have any questions about your medicines. °· You have more leg pain, especially if your pain medicine stops working. °· New or growing bruises develop on your leg. °· Your leg swells, feels tight, or becomes red. °· You have numbness in your leg. °SEEK IMMEDIATE MEDICAL CARE IF: °· Your pain gets much worse. °· Blood or fluid leaks from any of the incisions. °· Your incisions become warm, swollen, or red. °· You have chest pain. °· You have trouble breathing. °· You have a fever. °· You have more pain near your leg incision. °MAKE SURE YOU: °· Understand these instructions. °· Will watch your condition. °· Will get help right away if you are not doing well or get worse. °Document Released: 08/01/2011 Document Revised: 02/11/2012 Document Reviewed: 08/01/2011 °Exit[employer]® Patient Information ©2014 [employer]. ° ° °

## 2014-04-29 ENCOUNTER — Ambulatory Visit: Payer: No Typology Code available for payment source | Admitting: Cardiology

## 2014-05-17 ENCOUNTER — Other Ambulatory Visit: Payer: Self-pay | Admitting: Surgery

## 2014-05-17 DIAGNOSIS — I428 Other cardiomyopathies: Secondary | ICD-10-CM

## 2014-05-17 NOTE — Addendum Note (Signed)
Addendum created 05/17/14 1859 by Laverle Hobby, MD   Modules edited: Anesthesia Events, Anesthesia Flowsheet

## 2014-05-19 ENCOUNTER — Ambulatory Visit (INDEPENDENT_AMBULATORY_CARE_PROVIDER_SITE_OTHER): Payer: Self-pay | Admitting: Surgery

## 2014-05-19 ENCOUNTER — Ambulatory Visit
Admission: RE | Admit: 2014-05-19 | Discharge: 2014-05-19 | Disposition: A | Discharge status: Home / Self Care | Payer: No Typology Code available for payment source | Source: Ambulatory Visit | Attending: Surgery | Admitting: Surgery

## 2014-05-19 ENCOUNTER — Encounter: Payer: Self-pay | Admitting: Surgery

## 2014-05-19 VITALS — BP 130/72 | HR 78 | Resp 16 | Ht 65.0 in | Wt 139.0 lb

## 2014-05-19 DIAGNOSIS — Z951 Presence of aortocoronary bypass graft: Secondary | ICD-10-CM

## 2014-05-19 DIAGNOSIS — I2510 Atherosclerotic heart disease of native coronary artery without angina pectoris: Secondary | ICD-10-CM

## 2014-05-19 DIAGNOSIS — I428 Other cardiomyopathies: Secondary | ICD-10-CM

## 2014-05-19 NOTE — Progress Notes (Signed)
HPI:  Patient returns for routine postoperative follow-up having undergone CABG x 3 on 04/19/2014. He is here with his friend who speaks [place] and an interpreter. The patient's early postoperative recovery while in the hospital was notable for some respiratory difficulty with cough and low oxygen sats. His preop PFT's showed severe COPD. He was discharged on home oxygen at 3L [place]. Since hospital discharge the patient reports that he has been slowly improving. He is still using the oxygen. He is walking in the house but has not gotten outside.   Current Outpatient Prescriptions  Medication Sig Dispense Refill  . acetaminophen (TYLENOL) 325 MG tablet Take 325-650 mg by mouth every 6 (six) hours as needed for mild pain or headache.      . albuterol (PROVENTIL HFA;VENTOLIN HFA) 108 (90 BASE) MCG/ACT inhaler Inhale 2 puffs into the lungs every 6 (six) hours as needed for wheezing or shortness of breath.      Marland Kitchen. aspirin EC 325 MG EC tablet Take 1 tablet (325 mg total) by mouth daily.  30 tablet  12  . atorvastatin (LIPITOR) 40 MG tablet Take 1 tablet (40 mg total) by mouth daily.  30 tablet  3  . Fluticasone-Salmeterol (ADVAIR) 250-50 MCG/DOSE AEPB Inhale 1 puff into the lungs 2 (two) times daily.      Marland Kitchen. guaiFENesin (MUCINEX) 600 MG 12 hr tablet Take 2 tablets (1,200 mg total) by mouth 2 (two) times daily as needed for cough.  60 tablet  1  . metoprolol tartrate (LOPRESSOR) 25 MG tablet Take 1 tablet (25 mg total) by mouth 2 (two) times daily.  60 tablet  3  . Multiple Vitamins-Minerals (MULTIVITAMIN PO) Take by mouth daily.      Marland Kitchen. oxyCODONE (OXY IR/ROXICODONE) 5 MG immediate release tablet Take 1-2 tablets (5-10 mg total) by mouth every 3 (three) hours as needed for severe pain.  30 tablet  0   No current facility-administered medications for this visit.    Physical Exam: BP 130/72  Pulse 78  Resp 16  Ht 5\' 5"  (1.651 m)  Wt 139 lb (63.05 kg)  BMI 23.13 kg/m2  SpO2 98% on 3L [place] , sats  off oxygen are 95% today.  He looks well. Lung exam is clear. Cardiac exam shows a regular rate and rhythm with normal heart sounds. Chest incision is healing well and sternum is stable. The leg incisions are healing well and there is no peripheral edema.    Diagnostic Tests:  CLINICAL DATA: Shortness of breath and heart surgery.  EXAM:  CHEST 2 VIEW  COMPARISON: 04/21/2014. 12/30/2013.  FINDINGS:  Biapical pleural parenchymal scarring, right greater than left, is  stable since 12/30/2013. Interstitial markings are diffusely  coarsened with chronic features. No edema or focal airspace  consolidation. No evidence for pleural effusion. The  cardiopericardial silhouette is within normal limits for size.  Imaged bony structures of the thorax are intact.  IMPRESSION:  No acute cardiopulmonary findings.  Stable asymmetric biapical pleural parenchymal scarring.  Electronically Signed  By: Kennith CenterEric Mansell M.D.  On: 05/19/2014 15:29    Impression:  Overall I think he is doing well. I encouraged him to continue walking and to get outside. I recommended cardiac rehab but he does not have insurance and says he can't afford it. I told him he could drive his car but should not lift anything heavier than 10 lbs for three months postop.The oxygen will be discontinued.   Plan:  He will continue to  follow up with cardiology and will contact me if he develops any problems with his incisions.

## 2014-05-20 ENCOUNTER — Encounter: Payer: Self-pay | Admitting: Cardiology

## 2014-05-20 ENCOUNTER — Ambulatory Visit (INDEPENDENT_AMBULATORY_CARE_PROVIDER_SITE_OTHER): Payer: No Typology Code available for payment source | Admitting: Cardiology

## 2014-05-20 VITALS — BP 182/85 | HR 95 | Ht 62.0 in | Wt 133.0 lb

## 2014-05-20 DIAGNOSIS — I2589 Other forms of chronic ischemic heart disease: Secondary | ICD-10-CM

## 2014-05-20 DIAGNOSIS — I5022 Chronic systolic (congestive) heart failure: Secondary | ICD-10-CM

## 2014-05-20 DIAGNOSIS — I4510 Unspecified right bundle-branch block: Secondary | ICD-10-CM

## 2014-05-20 DIAGNOSIS — I2510 Atherosclerotic heart disease of native coronary artery without angina pectoris: Secondary | ICD-10-CM | POA: Insufficient documentation

## 2014-05-20 DIAGNOSIS — I255 Ischemic cardiomyopathy: Secondary | ICD-10-CM | POA: Insufficient documentation

## 2014-05-20 DIAGNOSIS — J449 Chronic obstructive pulmonary disease, unspecified: Secondary | ICD-10-CM

## 2014-05-20 DIAGNOSIS — I10 Essential (primary) hypertension: Secondary | ICD-10-CM | POA: Insufficient documentation

## 2014-05-20 DIAGNOSIS — E785 Hyperlipidemia, unspecified: Secondary | ICD-10-CM | POA: Insufficient documentation

## 2014-05-20 MED ORDER — METOPROLOL SUCCINATE ER 100 MG PO TB24: 100.0000 mg | ORAL_TABLET | Freq: Every day | ORAL | Status: AC

## 2014-05-20 MED ORDER — LISINOPRIL 10 MG PO TABS: 10.0000 mg | ORAL_TABLET | Freq: Every day | ORAL | Status: AC

## 2014-05-20 NOTE — Progress Notes (Signed)
[address] [place], [place]  [address] Phone: [phone] Fax:  [phone]  Date:  05/20/2014   ID:  Peter Kennedy, DOB 06/09/1935, MRN [id number]  PCP:  No PCP Per Patient   History of Present Illness: Peter Kennedy is a 79 y.o. male year post bypass surgery by Dr. Laneta SimmersBartle in may of 2015 here for followup. Had ejection fraction of 38% on nuclear stress test in March of 2015. Cardiac catheterization revealed triple-vessel coronary artery disease. He has been doing very well post bypass. Mild chest discomfort with deep inspiration while utilizing incentive spirometer. Minimal cough. No fevers. Chest wound well healing. Blood pressure is increased.   Wt Readings from Last 3 Encounters:  05/20/14 133 lb (60.328 kg)  05/19/14 139 lb (63.05 kg)  04/25/14 138 lb (62.596 kg)     Past Medical History  Diagnosis Date  . Hypertension   . Coronary artery disease   . SOB (shortness of breath)   . Eye inflamed   . Hx of cardiovascular stress test     Lexiscan Myoview (01/2014):  Inf, apical and IL scar; no ischemia; EF 38%; Mod Risk.  Marland Kitchen. GERD (gastroesophageal reflux disease)     very spicy food,does not take anything    Past Surgical History  Procedure Laterality Date  . Coronary artery bypass graft N/A 04/19/2014    Procedure: CORONARY ARTERY BYPASS GRAFTING (CABG) ;  Surgeon: Alleen BorneBryan K Bartle, MD;  [facility];  Service: Open Heart Surgery;  Laterality: N/A;  . Intraoperative transesophageal echocardiogram N/A 04/19/2014    Procedure: INTRAOPERATIVE TRANSESOPHAGEAL ECHOCARDIOGRAM;  Surgeon: Alleen BorneBryan K Bartle, MD;  [facility];  Service: Open Heart Surgery;  Laterality: N/A;    Current Outpatient Prescriptions  Medication Sig Dispense Refill  . acetaminophen (TYLENOL) 325 MG tablet Take 325-650 mg by mouth every 6 (six) hours as needed for mild pain or headache.      . albuterol (PROVENTIL HFA;VENTOLIN HFA) 108 (90 BASE) MCG/ACT inhaler Inhale 2 puffs into the lungs  every 6 (six) hours as needed for wheezing or shortness of breath.      Marland Kitchen. aspirin EC 325 MG EC tablet Take 1 tablet (325 mg total) by mouth daily.  30 tablet  12  . atorvastatin (LIPITOR) 40 MG tablet Take 1 tablet (40 mg total) by mouth daily.  30 tablet  3  . Fluticasone-Salmeterol (ADVAIR) 250-50 MCG/DOSE AEPB Inhale 1 puff into the lungs 2 (two) times daily.      Marland Kitchen. guaiFENesin (MUCINEX) 600 MG 12 hr tablet Take 2 tablets (1,200 mg total) by mouth 2 (two) times daily as needed for cough.  60 tablet  1  . metoprolol tartrate (LOPRESSOR) 25 MG tablet Take 1 tablet (25 mg total) by mouth 2 (two) times daily.  60 tablet  3  . Multiple Vitamins-Minerals (MULTIVITAMIN PO) Take by mouth daily.      Marland Kitchen. oxyCODONE (OXY IR/ROXICODONE) 5 MG immediate release tablet Take 1-2 tablets (5-10 mg total) by mouth every 3 (three) hours as needed for severe pain.  30 tablet  0   No current facility-administered medications for this visit.    Allergies:    Allergies  Allergen Reactions  . Other     NO PORK OR ANY TYPE OF MEAT    Social History:  The patient  reports that he quit smoking about 20 years ago. He does not have any smokeless tobacco history on file. He reports that he  does not drink alcohol or use illicit drugs.   No family history on file.  ROS:  Please see the history of present illness.   Denies any fevers, chills, orthopnea, PND   All other systems reviewed and negative.   PHYSICAL EXAM: VS:  BP 182/85  Pulse 95  Ht 5\' 2"  (1.575 m)  Wt 133 lb (60.328 kg)  BMI 24.32 kg/m2 Well nourished, well developed, in no acute distress HEENT: normal, [place]/AT, EOMI Neck: no JVD, normal carotid upstroke, no bruit Cardiac:  normal S1, S2; RRR; no murmurChest scar well healed Lungs:  clear to auscultation bilaterally, no wheezing, rhonchi or rales Abd: soft, nontender, no hepatomegaly, no bruits Ext: no edema, 2+ distal pulses Skin: warm and dry GU: deferred Neuro: no focal abnormalities noted, AAO  x 3  EKG:  05/20/14-sinus rhythm, 95, nonspecific interventricular conduction delay, T wave inversion noted in lateral leads as well as inferior leads.     ASSESSMENT AND PLAN:  1. Coronary artery disease-status post bypass surgery in may of 2015. Overall doing well. Dr. Laneta Simmers. Secondary prevention. No heavy lifting for 3 months post surgery. 2. Hypertension-blood pressure elevated today-I will increase his beta blocker. 3. Cardiomyopathy-prior ejection fraction 30%. With his increase in blood pressure, hopefully this is a result of increased cardiac output perhaps. Nonetheless, I will adjust some of his medications. I will start him on lisinopril 10 mg once a day. Last creatinine in normal range. I will also change his metoprolol tartrate over 2 metoprolol succinate and increased to 100 mg daily. We will see him back in one month followup. I will check a basic metabolic profile and lipid profile. At some point check echocardiogram again to reassess his ejection fraction. 4. Hyperlipidemia-atorvastatin 40. We will check lipid profile at next visit. Last LDL on 02/08/14 was 143. 5. COPD-preoperative PFTs demonstrated severe COPD. [facility]. 6. I will see him back in one - two months.  Signed, Donato Schultz, MD [facility]  05/20/2014 9:59 AM

## 2014-05-20 NOTE — Patient Instructions (Addendum)
Your physician recommends that you schedule a follow-up appointment in:1- 2 months with DR. Skains  Your physician recommends that you return for lab work in: 1-2 months same day as appt with Dr. Anne Fu...BMET,LIPID  Your physician has recommended you make the following change in your medication:  1. Stop Metoprolol 2. Start Metoprolol succ 100 mg 1 tab daily 3. Start Lisinopril 10 mg 1 tab po daily

## 2014-06-09 ENCOUNTER — Ambulatory Visit: Payer: No Typology Code available for payment source | Admitting: Cardiology

## 2014-06-10 ENCOUNTER — Encounter: Payer: Self-pay | Admitting: Cardiology

## 2014-07-16 ENCOUNTER — Other Ambulatory Visit (INDEPENDENT_AMBULATORY_CARE_PROVIDER_SITE_OTHER): Payer: No Typology Code available for payment source

## 2014-07-16 DIAGNOSIS — I4510 Unspecified right bundle-branch block: Secondary | ICD-10-CM

## 2014-07-16 LAB — LIPID PANEL
Cholesterol: 124 mg/dL (ref 0–200)
HDL: 34.9 mg/dL — ABNORMAL LOW (ref >39.00)
LDL Cholesterol: 59 mg/dL (ref 0–99)
NonHDL: 89.1
TRIGLYCERIDES: 153 mg/dL — AB (ref 0.0–149.0)
Total CHOL/HDL Ratio: 4
VLDL: 30.6 mg/dL (ref 0.0–40.0)

## 2014-07-16 LAB — BASIC METABOLIC PANEL
BUN: 22 mg/dL (ref 6–23)
CALCIUM: 9.7 mg/dL (ref 8.4–10.5)
CHLORIDE: 101 meq/L (ref 96–112)
CO2: 25 mEq/L (ref 19–32)
CREATININE: 1 mg/dL (ref 0.4–1.5)
GFR: 74.31 mL/min (ref >60.00)
Glucose, Bld: 130 mg/dL — ABNORMAL HIGH (ref 70–99)
Potassium: 5 mEq/L (ref 3.5–5.1)
Sodium: 134 mEq/L — ABNORMAL LOW (ref 135–145)

## 2014-07-21 ENCOUNTER — Ambulatory Visit: Payer: No Typology Code available for payment source | Admitting: Cardiology

## 2014-07-21 ENCOUNTER — Other Ambulatory Visit: Payer: No Typology Code available for payment source

## 2014-07-26 ENCOUNTER — Encounter: Payer: Self-pay | Admitting: *Deleted

## 2014-07-30 ENCOUNTER — Other Ambulatory Visit: Payer: Self-pay | Admitting: Cardiology

## 2014-11-11 ENCOUNTER — Encounter (HOSPITAL_COMMUNITY): Payer: Self-pay | Admitting: Cardiology

## 2015-01-25 IMAGING — CR DG CHEST 2V
2 series · 2 of 2 positions shown · non-contrast
Comparison: 12/22/2013

CLINICAL DATA: Preop for cardiac surgery

EXAM:
CHEST  2 VIEW

[w chest pa]
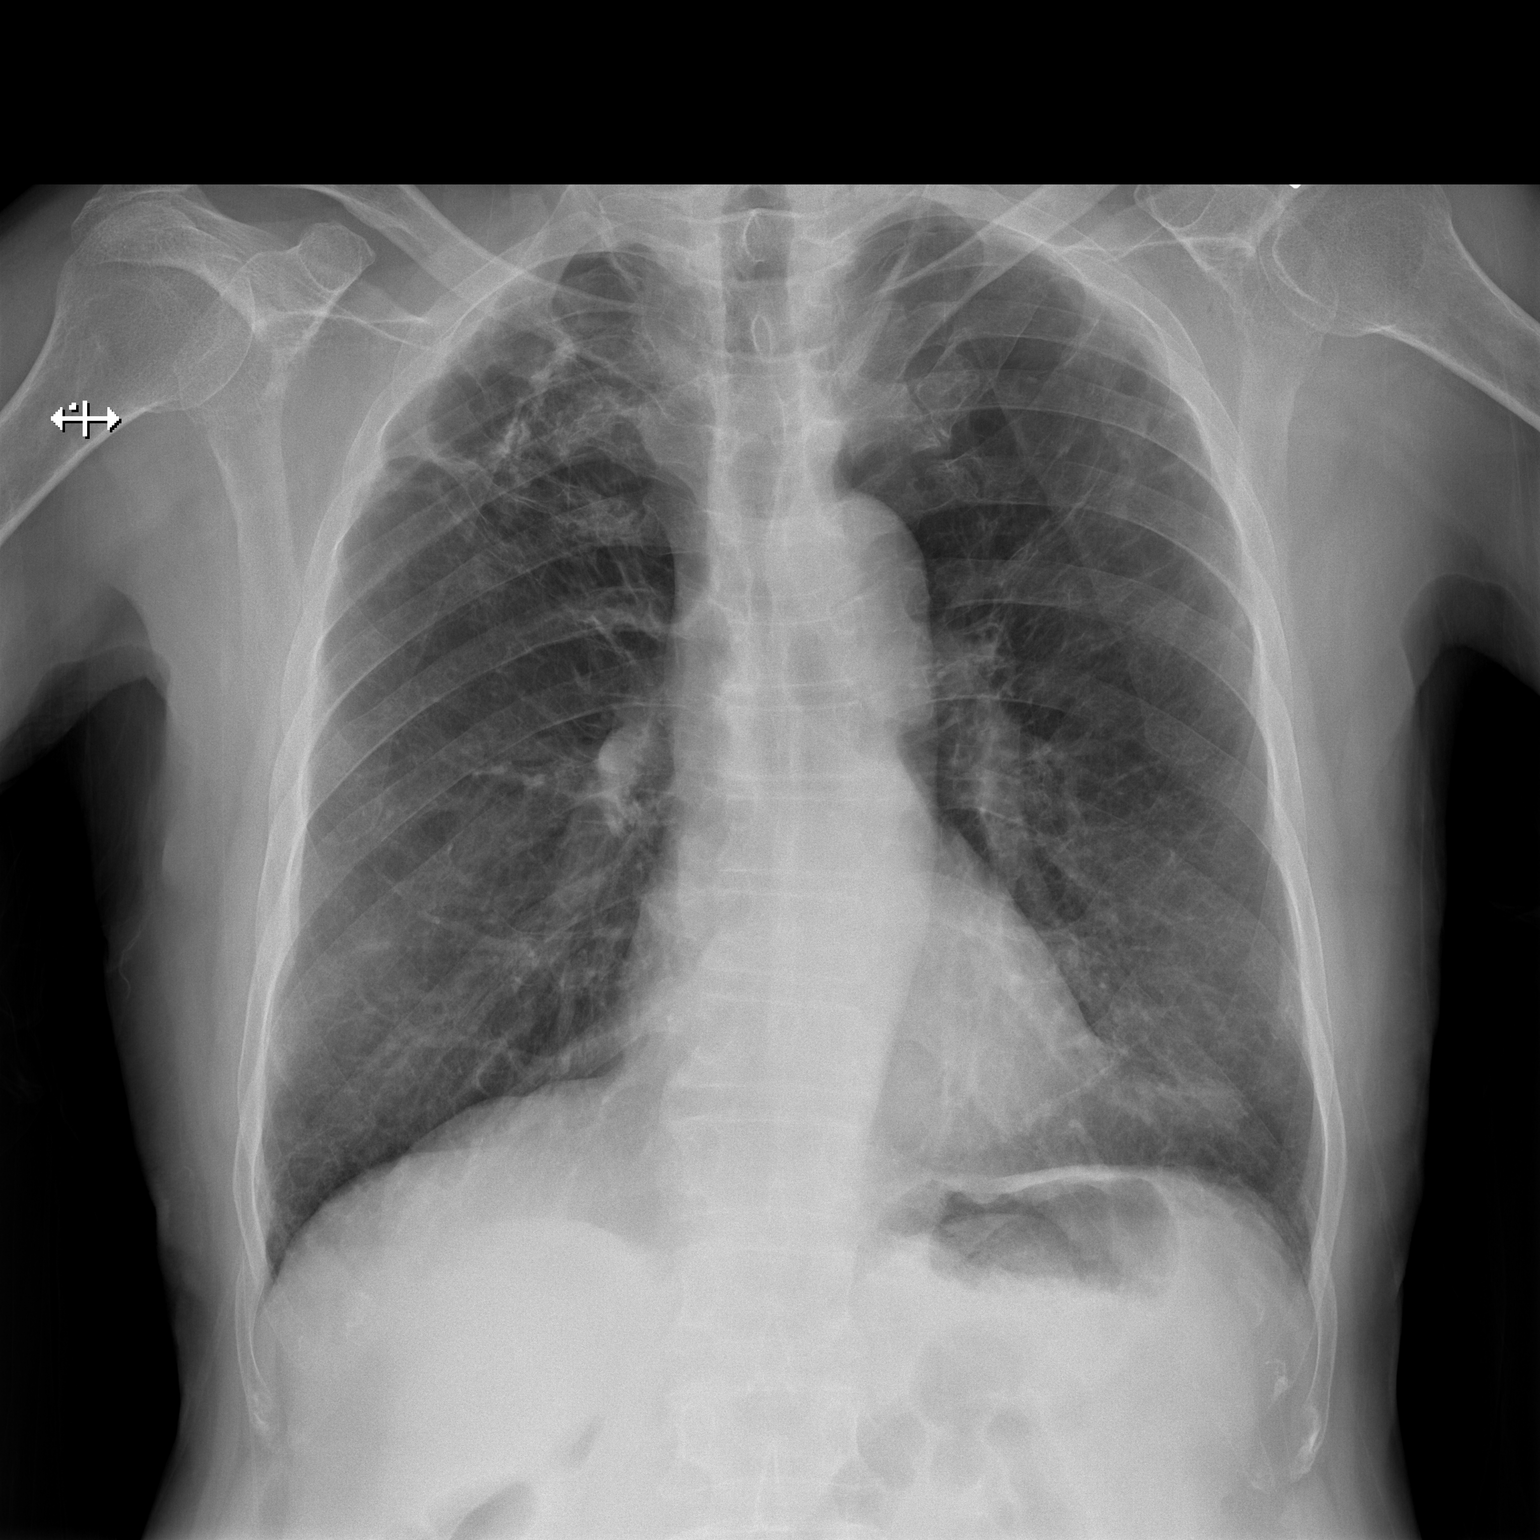

[w chest lat]
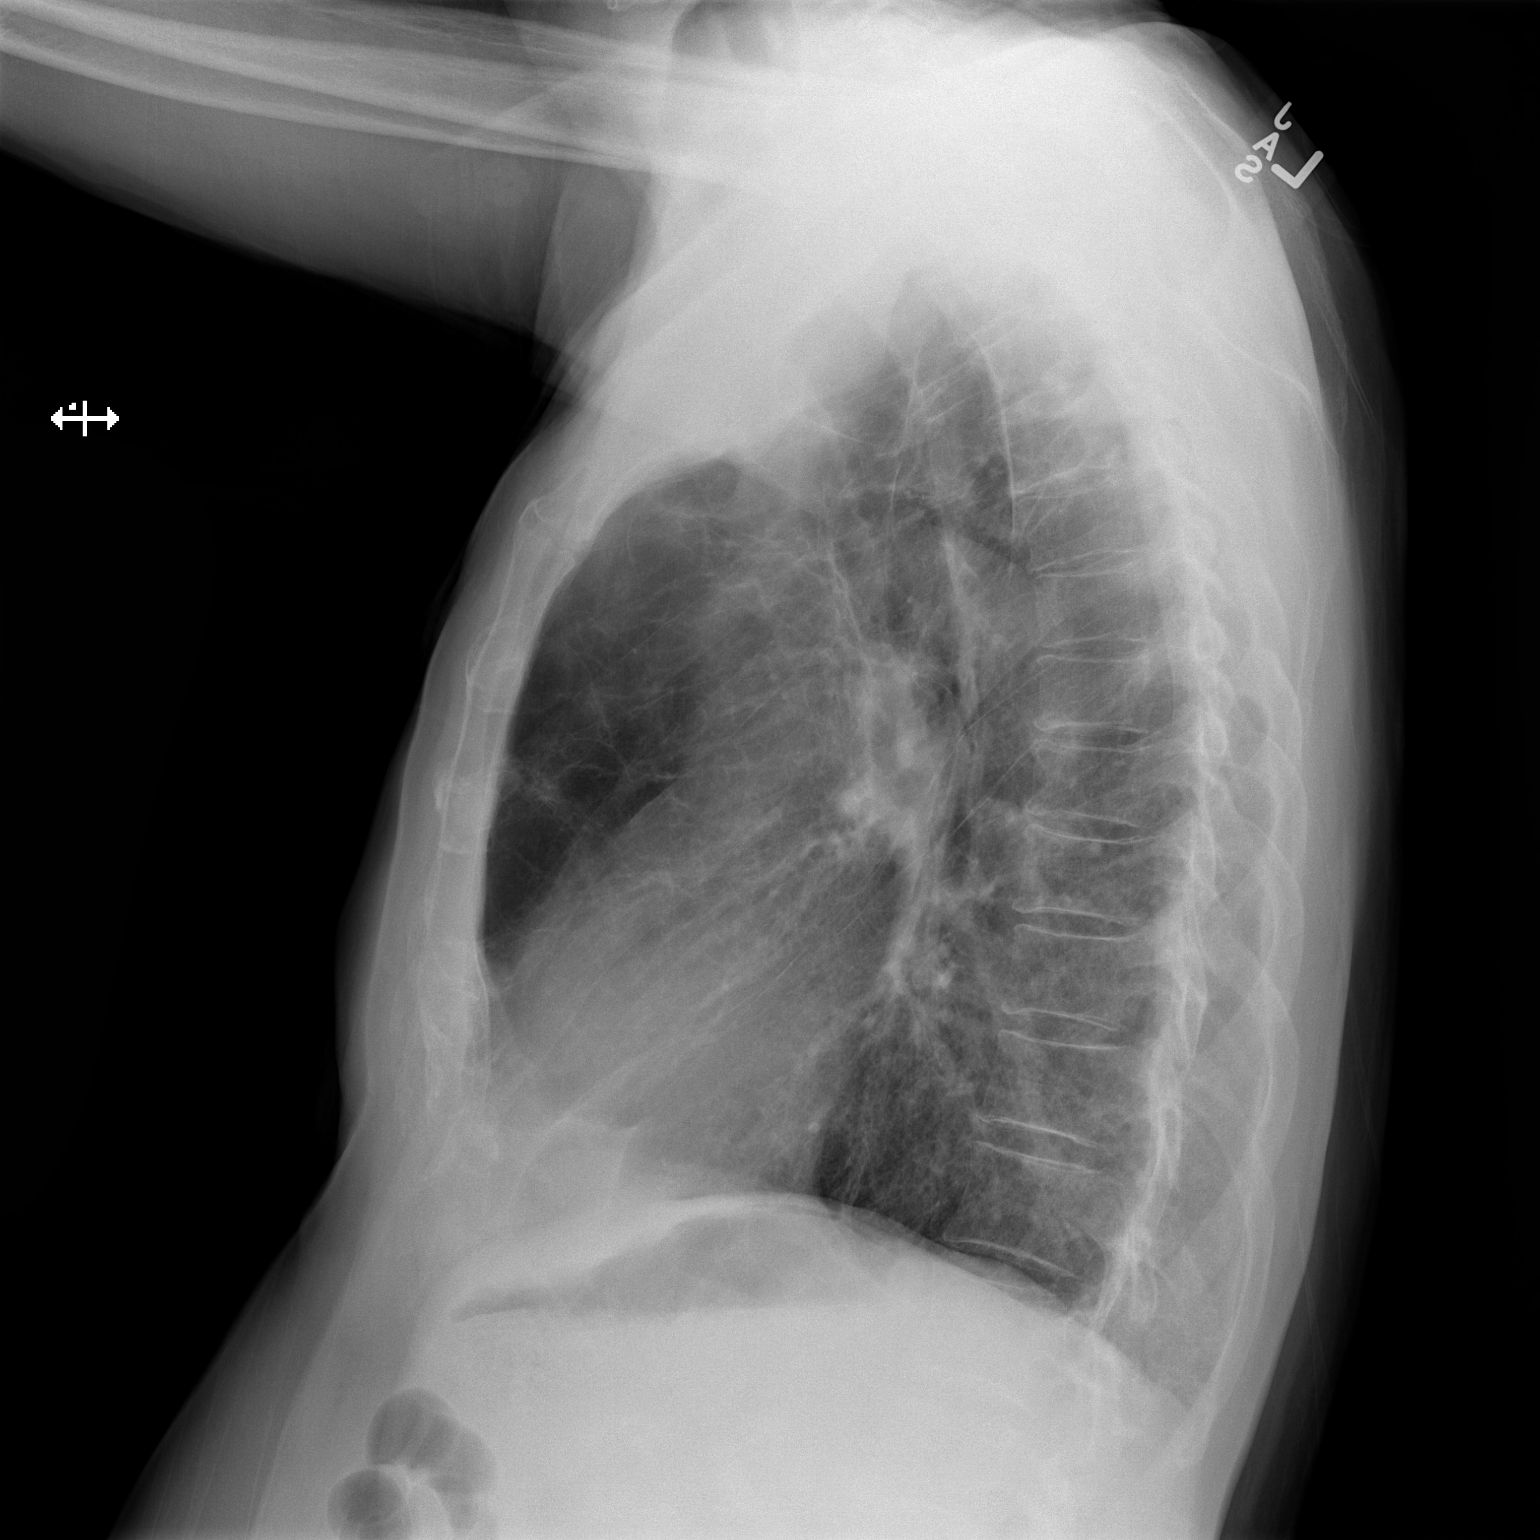

[2 of 2 positions shown; findings below may reference images not displayed]

FINDINGS: Cardiomediastinal silhouette is stable. No acute infiltrate or
pleural effusion. No pulmonary edema. Stable scarring in right upper
lobe. Bony thorax is unremarkable.
IMPRESSION: No active disease.  Stable scarring in right upper lobe.

## 2015-01-28 IMAGING — CR DG CHEST 1V PORT
1 series · 1 of 1 positions shown · non-contrast
Comparison: 04/16/2014.

CLINICAL DATA: Postop CABG.

EXAM:
PORTABLE CHEST - 1 VIEW

[AP]
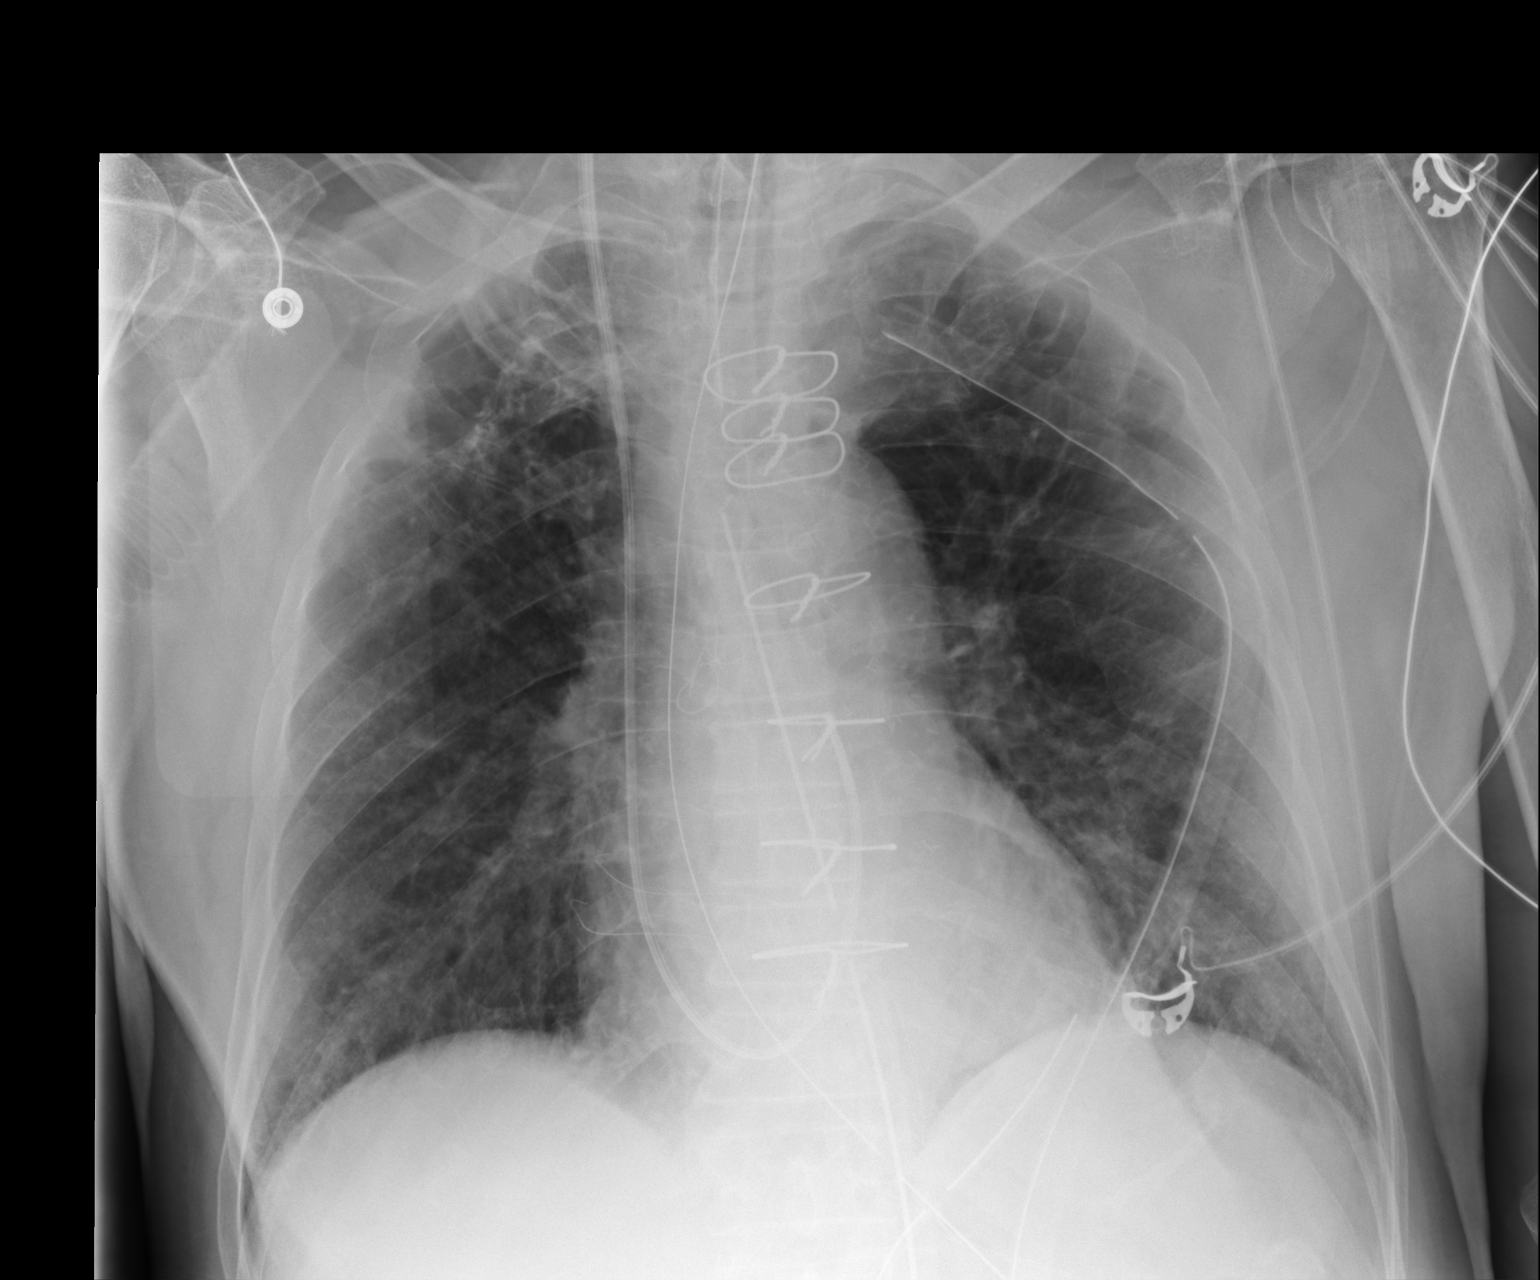

[1 of 1 positions shown; findings below may reference images not displayed]

FINDINGS: 0112 hr. Endotracheal tube tip is in the midtrachea. Right IJ
Swan-Ganz catheter is in the right ventricular outflow tract or main
pulmonary artery. Nasogastric tube, mediastinal drain and left chest
tubes are in place. There is no pneumothorax. There is stable right
upper lobe scarring. The heart size and mediastinal contours are
stable status post interval CABG.
IMPRESSION: No demonstrated complication following CABG.

## 2015-01-29 IMAGING — CR DG CHEST 1V PORT
1 series · 1 of 1 positions shown · non-contrast
Comparison: DG CHEST 1V PORT dated 04/19/2014; DG CHEST 2V dated
12/30/2013

CLINICAL DATA: Coronary artery disease.

EXAM:
PORTABLE CHEST - 1 VIEW

[AP]
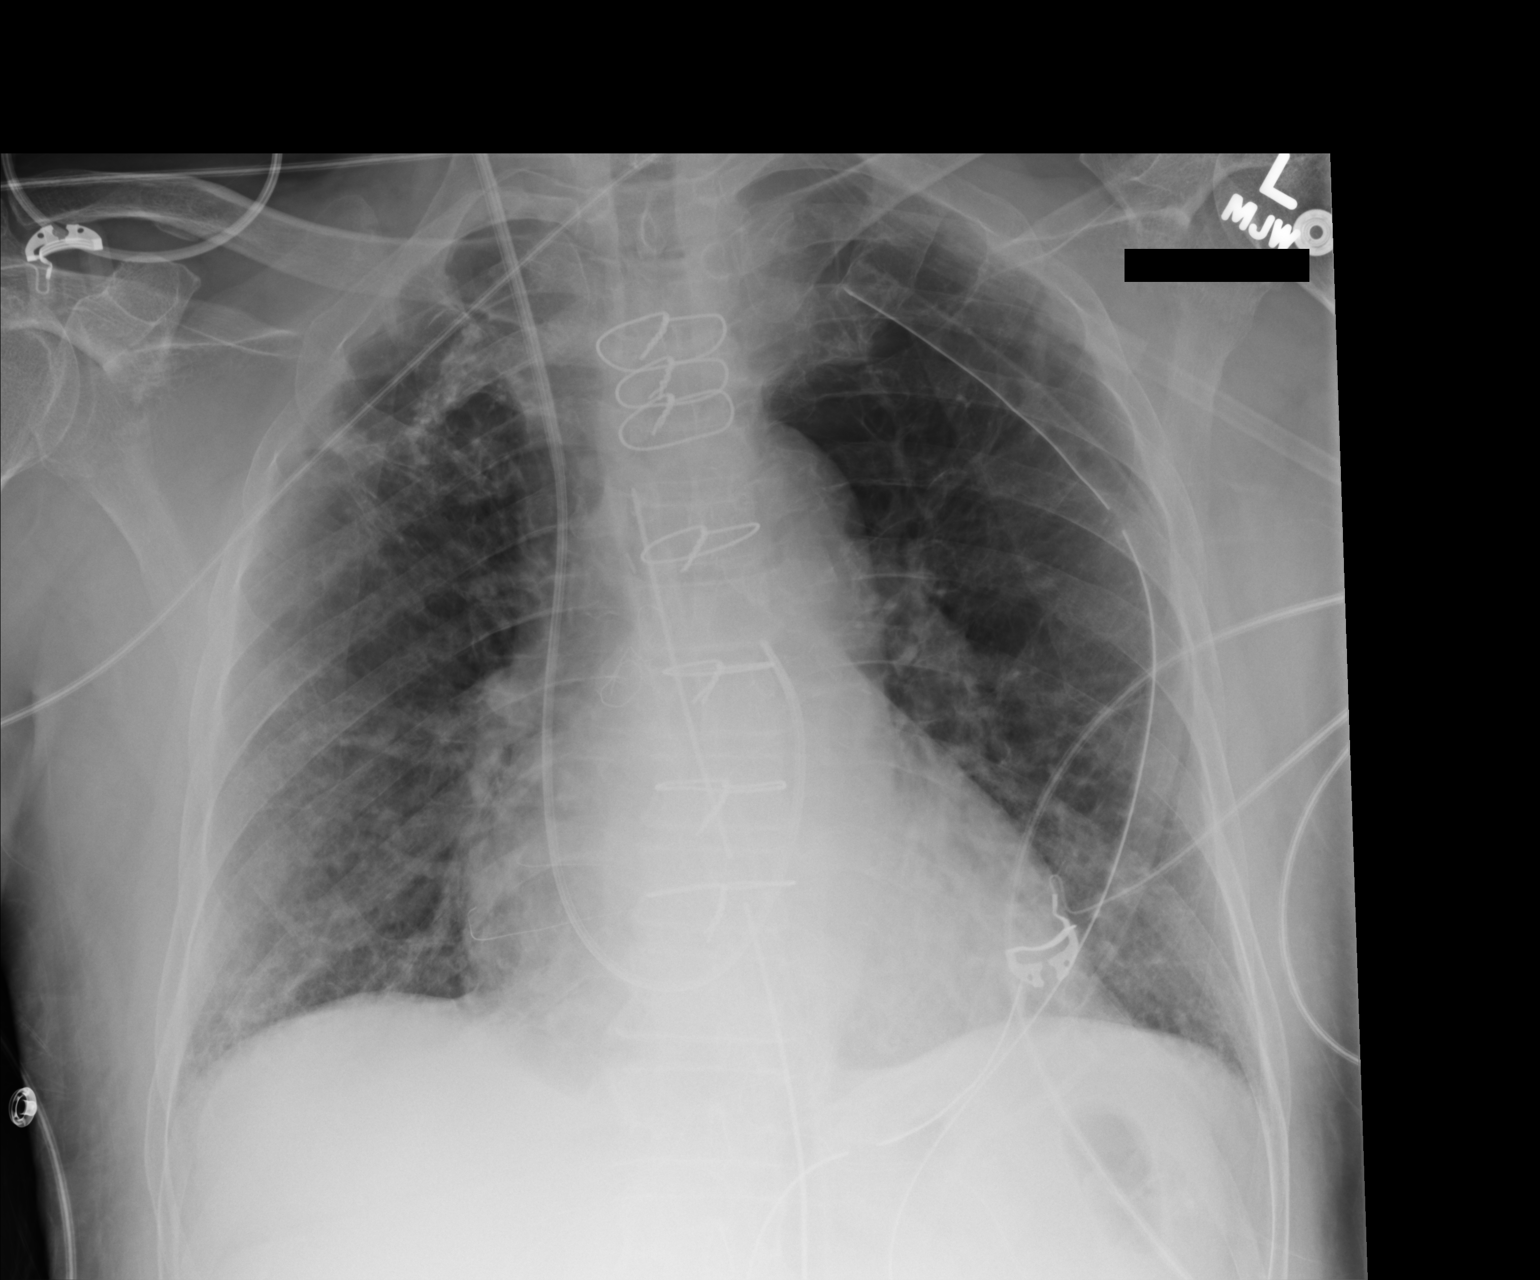

[1 of 1 positions shown; findings below may reference images not displayed]

FINDINGS: Interim extubation and removal of NG tube. Left chest tube,
Swan-Ganz catheter, mediastinal drainage catheter in stable
position. Stable cardiomegaly. Prior CABG. Normal pulmonary
vascularity. Right apical pleural parenchymal thickening with
calcification again noted consistent with scarring possibly related
to prior infection. Stable interstitial prominence throughout both
lungs suggesting chronic interstitial lung disease. Pneumonitis
cannot be entirely excluded. No pneumothorax. No acute osseous
abnormality.
IMPRESSION: 1. Interim extubation and removal of NG tube.
2. Left chest tube, Swan-Ganz catheter, mediastinal drainage
catheter in stable position.
3. Prior CABG.  Stable cardiomegaly, no pulmonary venous congestion.
4. Chronic interstitial lung disease with right apical pleural
parenchymal scarring .
